# Patient Record
Sex: Female | Born: 1975 | Hispanic: No | Marital: Married | Smoking: Never smoker
Health system: Southern US, Community
[De-identification: ages and names within clinical notes are randomized; demographics above are authoritative.]

## PROBLEM LIST (undated history)

## (undated) DIAGNOSIS — R87619 Unspecified abnormal cytological findings in specimens from cervix uteri: Secondary | ICD-10-CM

## (undated) DIAGNOSIS — E079 Disorder of thyroid, unspecified: Secondary | ICD-10-CM

## (undated) DIAGNOSIS — D649 Anemia, unspecified: Secondary | ICD-10-CM

## (undated) DIAGNOSIS — B977 Papillomavirus as the cause of diseases classified elsewhere: Secondary | ICD-10-CM

## (undated) DIAGNOSIS — B009 Herpesviral infection, unspecified: Secondary | ICD-10-CM

## (undated) DIAGNOSIS — E039 Hypothyroidism, unspecified: Secondary | ICD-10-CM

## (undated) DIAGNOSIS — E041 Nontoxic single thyroid nodule: Secondary | ICD-10-CM

## (undated) DIAGNOSIS — Z1159 Encounter for screening for other viral diseases: Secondary | ICD-10-CM

## (undated) HISTORY — DX: Nontoxic single thyroid nodule: E04.1

## (undated) HISTORY — PX: COLPOSCOPY: SHX161

## (undated) HISTORY — PX: OTHER SURGICAL HISTORY: SHX169

## (undated) HISTORY — DX: Anemia, unspecified: D64.9

## (undated) HISTORY — DX: Hypothyroidism, unspecified: E03.9

## (undated) HISTORY — DX: Herpesviral infection, unspecified: B00.9

## (undated) HISTORY — DX: Encounter for screening for other viral diseases: Z11.59

## (undated) HISTORY — DX: Papillomavirus as the cause of diseases classified elsewhere: B97.7

## (undated) HISTORY — DX: Unspecified abnormal cytological findings in specimens from cervix uteri: R87.619

---
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
## 1999-01-24 ENCOUNTER — Emergency Department (HOSPITAL_COMMUNITY): Admission: EM | Admit: 1999-01-24 | Discharge: 1999-01-24 | Payer: Self-pay | Admitting: *Deleted

## 1999-01-25 ENCOUNTER — Encounter: Payer: Self-pay | Admitting: *Deleted

## 1999-11-27 ENCOUNTER — Ambulatory Visit (HOSPITAL_COMMUNITY): Admission: RE | Admit: 1999-11-27 | Discharge: 1999-11-27 | Payer: Self-pay | Admitting: Obstetrics and Gynecology

## 1999-11-27 ENCOUNTER — Encounter: Payer: Self-pay | Admitting: Obstetrics and Gynecology

## 2000-03-26 ENCOUNTER — Inpatient Hospital Stay (HOSPITAL_COMMUNITY): Admission: AD | Admit: 2000-03-26 | Discharge: 2000-03-26 | Payer: Self-pay | Admitting: Obstetrics and Gynecology

## 2000-04-02 ENCOUNTER — Inpatient Hospital Stay (HOSPITAL_COMMUNITY): Admission: AD | Admit: 2000-04-02 | Discharge: 2000-04-04 | Payer: Self-pay | Admitting: Obstetrics & Gynecology

## 2001-01-24 ENCOUNTER — Emergency Department (HOSPITAL_COMMUNITY): Admission: EM | Admit: 2001-01-24 | Discharge: 2001-01-24 | Payer: Self-pay | Admitting: Emergency Medicine

## 2001-02-02 ENCOUNTER — Encounter: Payer: Self-pay | Admitting: Family Medicine

## 2001-02-02 ENCOUNTER — Ambulatory Visit (HOSPITAL_COMMUNITY): Admission: RE | Admit: 2001-02-02 | Discharge: 2001-02-02 | Payer: Self-pay | Admitting: Family Medicine

## 2002-08-30 ENCOUNTER — Other Ambulatory Visit: Admission: RE | Admit: 2002-08-30 | Discharge: 2002-08-30 | Payer: Self-pay | Admitting: Obstetrics & Gynecology

## 2003-08-14 ENCOUNTER — Other Ambulatory Visit: Admission: RE | Admit: 2003-08-14 | Discharge: 2003-08-14 | Payer: Self-pay | Admitting: Obstetrics & Gynecology

## 2004-02-29 ENCOUNTER — Ambulatory Visit (HOSPITAL_COMMUNITY): Admission: RE | Admit: 2004-02-29 | Discharge: 2004-02-29 | Payer: Self-pay | Admitting: Obstetrics & Gynecology

## 2004-12-24 ENCOUNTER — Inpatient Hospital Stay (HOSPITAL_COMMUNITY): Admission: AD | Admit: 2004-12-24 | Discharge: 2004-12-26 | Payer: Self-pay | Admitting: Obstetrics and Gynecology

## 2008-01-17 ENCOUNTER — Ambulatory Visit: Payer: Self-pay | Admitting: Internal Medicine

## 2008-01-17 LAB — CONVERTED CEMR LAB
ALT: 11 units/L (ref 0–35)
AST: 13 units/L (ref 0–37)
Albumin: 4.1 g/dL (ref 3.5–5.2)
Alkaline Phosphatase: 55 units/L (ref 39–117)
BUN: 9 mg/dL (ref 6–23)
Basophils Relative: 1.2 % — ABNORMAL HIGH (ref 0.0–1.0)
CO2: 31 meq/L (ref 19–32)
Chloride: 105 meq/L (ref 96–112)
Cholesterol: 154 mg/dL (ref 0–200)
Creatinine, Ser: 0.7 mg/dL (ref 0.4–1.2)
Eosinophils Relative: 3.4 % (ref 0.0–5.0)
GFR calc non Af Amer: 103 mL/min
Hemoglobin, Urine: NEGATIVE
LDL Cholesterol: 98 mg/dL (ref 0–99)
Leukocytes, UA: NEGATIVE
Lymphocytes Relative: 52.8 % — ABNORMAL HIGH (ref 12.0–46.0)
Monocytes Relative: 6.2 % (ref 3.0–12.0)
Neutrophils Relative %: 36.4 % — ABNORMAL LOW (ref 43.0–77.0)
Nitrite: NEGATIVE
Platelets: 274 10*3/uL (ref 150–400)
Potassium: 3.9 meq/L (ref 3.5–5.1)
RBC: 4.03 M/uL (ref 3.87–5.11)
Specific Gravity, Urine: 1.01 (ref 1.000–1.03)
TSH: 1.43 microintl units/mL (ref 0.35–5.50)
Urine Glucose: NEGATIVE mg/dL
Urobilinogen, UA: 0.2 (ref 0.0–1.0)
WBC: 4.9 10*3/uL (ref 4.5–10.5)

## 2008-02-16 ENCOUNTER — Telehealth: Payer: Self-pay | Admitting: Internal Medicine

## 2008-08-21 ENCOUNTER — Ambulatory Visit: Payer: Self-pay | Admitting: Internal Medicine

## 2008-08-21 DIAGNOSIS — J069 Acute upper respiratory infection, unspecified: Secondary | ICD-10-CM | POA: Insufficient documentation

## 2010-07-22 ENCOUNTER — Emergency Department (HOSPITAL_BASED_OUTPATIENT_CLINIC_OR_DEPARTMENT_OTHER): Admission: EM | Admit: 2010-07-22 | Discharge: 2010-07-22 | Payer: Self-pay | Admitting: Emergency Medicine

## 2010-07-22 ENCOUNTER — Ambulatory Visit: Payer: Self-pay | Admitting: Diagnostic Radiology

## 2010-11-12 LAB — BASIC METABOLIC PANEL
BUN: 10 mg/dL (ref 6–23)
Chloride: 105 mEq/L (ref 96–112)
Creatinine, Ser: 0.6 mg/dL (ref 0.4–1.2)
GFR calc non Af Amer: >60 mL/min (ref >60)
Glucose, Bld: 102 mg/dL — ABNORMAL HIGH (ref 70–99)
Potassium: 4 mEq/L (ref 3.5–5.1)

## 2010-11-12 LAB — POCT CARDIAC MARKERS
CKMB, poc: <1 ng/mL — ABNORMAL LOW (ref 1.0–8.0)
CKMB, poc: <1 ng/mL — ABNORMAL LOW (ref 1.0–8.0)

## 2010-11-12 LAB — DIFFERENTIAL
Basophils Absolute: 0 10*3/uL (ref 0.0–0.1)
Basophils Relative: 0 % (ref 0–1)
Eosinophils Relative: 1 % (ref 0–5)
Monocytes Absolute: 0.6 10*3/uL (ref 0.1–1.0)

## 2010-11-12 LAB — PROTIME-INR
INR: 1.07 (ref 0.00–1.49)
Prothrombin Time: 14.1 seconds (ref 11.6–15.2)

## 2010-11-12 LAB — APTT: aPTT: 31 seconds (ref 24–37)

## 2010-11-12 LAB — CBC
HCT: 33.6 % — ABNORMAL LOW (ref 36.0–46.0)
MCHC: 35.2 g/dL (ref 30.0–36.0)
MCV: 90.4 fL (ref 78.0–100.0)
Platelets: 225 10*3/uL (ref 150–400)
RDW: 12.5 % (ref 11.5–15.5)
WBC: 8.6 10*3/uL (ref 4.0–10.5)

## 2011-01-17 NOTE — H&P (Signed)
[name], [name] Hurst               ACCOUNT NO.:  [id number]   MEDICAL RECORD NO.:  [id number]          PATIENT TYPE:  INP   LOCATION:  9145                          FACILITY:  [facility]   PHYSICIAN:  [name] Hurst, M.D.DATE OF BIRTH:  Feb 16, 1976   DATE OF ADMISSION:  12/24/2004  DATE OF DISCHARGE:                                HISTORY & PHYSICAL   CHIEF COMPLAINT:  Labor.   HISTORY OF PRESENT ILLNESS:  A 35 year old African-American female G4 P1 who  presents in active labor at 39 weeks. She has no known drug allergies.  Medications are prenatal vitamins. She has a history of three previous  pregnancies, two term deliveries, with one neonatal loss due to dehydration  in [place]. She has a history of also a 7-pound 6-ounce female and an 8-week  abortion. Family history is otherwise noncontributory for breast cancer,  heart disease, and hypertension.   PRENATAL LABORATORY DATA:  Reveals a blood type of O positive, Rh antibody  negative. Rubella immune. Hepatitis and HIV nonreactive. GBS is negative.   PHYSICAL EXAMINATION:  GENERAL:  Well-developed, well-nourished African-  American female, no acute distress.  HEENT:  Normal.  LUNGS:  Clear.  HEART:  Regular rhythm.  ABDOMEN:  Soft, gravid, nontender.  PELVIC:  Cervix 2 cm, 80%, vertex, -1.  EXTREMITIES:  Reveal no cords.  NEUROLOGIC:  Nonfocal.   IMPRESSION:  Term intrauterine pregnancy in active labor.   PLAN:  Proceed with attempts at vaginal delivery, epidural as needed,  Pitocin augmentation as needed.      RJT/MEDQ  D:  12/24/2004  T:  12/24/2004  Job:  [id number]

## 2011-01-17 NOTE — Op Note (Signed)
NAMEMONSERRATE, BLASCHKE               ACCOUNT NO.:  [id number]   MEDICAL RECORD NO.:  [id number]          PATIENT TYPE:  INP   LOCATION:  9145                          FACILITY:  [facility]   PHYSICIAN:  Lenoard Aden, M.D.DATE OF BIRTH:  06-Jan-1976   DATE OF PROCEDURE:  12/24/2004  DATE OF DISCHARGE:                                 OPERATIVE REPORT   OPERATIVE DELIVERY NOTE:   INDICATION FOR OPERATIVE DELIVERY:  Thick meconium with persistent variable  decelerations.   POSTOPERATIVE DIAGNOSIS:  Thick meconium with persistent variable  decelerations.   PROCEDURE:  Outlet vacuum-assisted delivery.   SURGEON:  Lenoard Aden, M.D.   ANESTHESIA:  Epidural.   ESTIMATED BLOOD LOSS:  500 mL.   COMPLICATIONS:  None.   DRAINS:  None.   Patient to recovery in good condition.   BRIEF OPERATIVE NOTE:  After being apprised of the risks and benefits of  vacuum assistance including the small incidence of cephalohematoma,  intracranial hemorrhage and scalp laceration, the Kiwi cup was shown to the  patient and the husband and explained in detail.  The bladder is  catheterized until empty.  The fetal vertex, LOA, less than 45 degrees, at  +3 to +4 station.  The Kiwi cup was placed for two pulls over intact  perineum.  A full-term living female, DeLee suctioning performed, Apgars 8 and  9.  Cord blood collected, cord pH collected.  Pediatricians in attendance.  Placenta is delivered spontaneously intact.  A three-vessel cord is noted.  Cervix is without lacerations, vagina without lacerations.  Estimated blood  loss 500 mL.  Mother and baby recovering in good condition.      RJT/MEDQ  D:  12/24/2004  T:  12/24/2004  Job:  [id number]

## 2011-01-17 NOTE — H&P (Signed)
[facility]  Patient:    [name] Hurst, [name] Hurst                        MRN: [id number] Adm. Date:  [id number] Attending:  Cleatrice Burke Dictator:   Wynelle Bourgeois, P.A.                         History and Physical  HISTORY OF PRESENT ILLNESS:   Ms. [name] Hurst is a 35 year old G3, para 1-0-1-0 at 40-6/7 weeks who presents for induction of labor for nonreactive NST and a positive CST.  She denies any leaking or bleeding.  Her pregnancy has been followed by the M.D. service and has been remarkable for:  #1 - Anemia, #2 - postdates, #3 - infant death at 5 months secondary to dehydration, #4 - history of heart surgery for septal defect, #5 - history of LGA infant, #6 - conceived on OCPs, #7 - questionable varicella exposure, and #8 - GBS is negative.  PRENATAL LABORATORY DATA:     Hemoglobin 11.4, hematocrit 34.7, platelets 287,000.  Blood type O-positive.  Antibody screen negative.  Sickle cell negative.  RPR nonreactive.  Rubella immune.  HBsAg negative.  HIV nonreactive.  Glucose challenge within normal limits.  Pap test:  ASCUS. Gonorrhea negative.  Chlamydia negative.  AFP free beta within normal limits.  OBSTETRICAL HISTORY:          Remarkable for a spontaneous vaginal delivery in April of 1998 of a viable female infant who weighed 5.5 kg at 40+ weeks gestation after 6 hours of labor induction.  This infant died at 34 months of age secondary to dehydration.  In 2000, she had an elective abortion at [redacted] weeks gestation.  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  History of alcohol use.  FAMILY HISTORY:               Family history is remarkable for a sister with hypertension, a sister with asthma, paternal aunt with breast cancer, paternal sister with domestic violence.  GENETIC HISTORY:              Genetic history is remarkable for  patient with congenital heart defect and patients cousin who had twins and a paternal grandmother who had twins.  SOCIAL HISTORY:               She is married to [name] Hurst, who is involved and supportive.  She is of the [place] faith.  She denies any alcohol or drug use currently.  PHYSICAL EXAMINATION  HEENT:                        Within normal limits.  NECK:                         Thyroid normal, not enlarged.  CHEST:                        Clear to auscultation bilaterally.  HEART:                        Regular.  ABDOMEN:  Gravid.  Vertex to [employer].  Her fetal monitor denotes stable fetal heart rate with questionable early decelerations with two uterine contractions, occasional accelerations and average variability with irregular uterine contractions every two to five minutes.  PELVIC:                       Cervical exam was 1 cm in the office.  EXTREMITIES:                  Within normal limits.  Negative Homans.  ASSESSMENT:                   1. Intrauterine pregnancy at 40-6/7 weeks.                               2. Nonreactive nonstress test with a positive                                  contraction stress test.                               3. Elective induction of labor.  PLAN:                         1. Admit to birthing suites per                                  Dr. Brita Romp. [place].                               2. Routine M.D. orders.                               3. Pitocin per protocol.                               4. Further orders per Dr. Kathryne Sharper. DD:  04/02/00 TD:  04/02/00 Job: [id number]

## 2011-07-28 ENCOUNTER — Encounter: Payer: Self-pay | Admitting: Internal Medicine

## 2011-08-12 ENCOUNTER — Ambulatory Visit: Payer: Self-pay | Admitting: Gastroenterology

## 2011-08-22 ENCOUNTER — Telehealth: Payer: Self-pay | Admitting: Gastroenterology

## 2011-08-22 NOTE — Telephone Encounter (Signed)
Message copied by Arna Snipe on Fri Aug 22, 2011  8:42 AM ------      Message from: Harlow Mares D      Created: Tue Aug 12, 2011 12:50 PM       Please bill for no show today

## 2011-09-05 ENCOUNTER — Ambulatory Visit: Payer: Self-pay | Admitting: Internal Medicine

## 2012-11-18 ENCOUNTER — Telehealth: Payer: Self-pay | Admitting: Gynecology

## 2012-11-22 NOTE — Telephone Encounter (Signed)
PT RETURNING JASMINE'S CALL

## 2012-11-23 NOTE — Telephone Encounter (Signed)
11/23/2012 pt. Notified of Neg pap results/cm

## 2014-04-19 ENCOUNTER — Encounter: Payer: Self-pay | Admitting: Certified Nurse Midwife

## 2014-04-20 ENCOUNTER — Ambulatory Visit (INDEPENDENT_AMBULATORY_CARE_PROVIDER_SITE_OTHER): Payer: 59 | Admitting: Certified Nurse Midwife

## 2014-04-20 ENCOUNTER — Encounter: Payer: Self-pay | Admitting: Certified Nurse Midwife

## 2014-04-20 VITALS — BP 110/62 | HR 68 | Resp 16 | Ht 63.75 in | Wt 167.0 lb

## 2014-04-20 DIAGNOSIS — Z0000 Encounter for general adult medical examination without abnormal findings: Secondary | ICD-10-CM

## 2014-04-20 DIAGNOSIS — Z124 Encounter for screening for malignant neoplasm of cervix: Secondary | ICD-10-CM

## 2014-04-20 DIAGNOSIS — Z01419 Encounter for gynecological examination (general) (routine) without abnormal findings: Secondary | ICD-10-CM

## 2014-04-20 LAB — POCT URINALYSIS DIPSTICK
BILIRUBIN UA: NEGATIVE
Glucose, UA: NEGATIVE
KETONES UA: NEGATIVE
Leukocytes, UA: NEGATIVE
Nitrite, UA: NEGATIVE
PH UA: 5
PROTEIN UA: NEGATIVE
RBC UA: NEGATIVE
Urobilinogen, UA: NEGATIVE

## 2014-04-20 NOTE — Patient Instructions (Signed)

## 2014-04-20 NOTE — Progress Notes (Signed)
38 y.o. [id number] Married African American Fe here for annual exam. Contraception Mirena IUD working well. 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 No health issues today. Sees PCP for aex and labs, all normal per patient.  No LMP recorded. Patient is not currently having periods (Reason: IUD).          Sexually active: Yes.    The current method of family planning is IUD.    Exercising: Yes.    walking Smoker:  no  Health Maintenance: Pap:  10-22-12 neg HPV HR neg MMG:  none Colonoscopy:  none BMD:   none TDaP:  2009 Labs: Poct urine-neg Self breast exam: done occ   reports that she has never smoked. She does not have any smokeless tobacco history on file. She reports that she drinks alcohol. She reports that she does not use illicit drugs.  History reviewed. No pertinent past medical history.  Past Surgical History  Procedure Laterality Date  . Valvular heart repair      age [age]  . Intrauterine device (iud) insertion  5/11    mirena     Current Outpatient Prescriptions  Medication Sig Dispense Refill  . levonorgestrel (MIRENA) 20 MCG/24HR IUD 1 each by Intrauterine route once.      . Levothyroxine Sodium (LEVOTHROID PO) Take by mouth daily.       No current facility-administered medications for this visit.    Family History  Problem Relation Age of Onset  . Cancer Mother     uterine  . Hypertension Mother   . Diabetes Mother   . Hypertension Father   . Diabetes Sister   . Breast cancer Paternal Aunt   . Diabetes Maternal Grandmother   . Cancer Sister     ROS:  Pertinent items are noted in HPI.  Otherwise, a comprehensive ROS was negative.  Exam:   BP 110/62  Pulse 68  Resp 16  Ht 5' 3.75" (1.619 m)  Wt 167 lb (75.751 kg)  BMI 28.90 kg/m2 Height: 5' 3.75" (161.9 cm)  Ht Readings from Last 3 Encounters:  04/20/14 5' 3.75" (1.619 m)    General appearance: alert, cooperative and appears stated age Head: Normocephalic, without obvious abnormality,  atraumatic Neck: no adenopathy, supple, symmetrical, trachea midline and thyroid normal to inspection and palpation Lungs: clear to auscultation bilaterally Breasts: normal appearance, no masses or tenderness, No nipple retraction or dimpling, No nipple discharge or bleeding, No axillary or supraclavicular adenopathy Heart: regular rate and rhythm Abdomen: soft, non-tender; no masses,  no organomegaly Extremities: extremities normal, atraumatic, no cyanosis or edema Skin: Skin color, texture, turgor normal. No rashes or lesions Lymph nodes: Cervical, supraclavicular, and axillary nodes normal. No abnormal inguinal nodes palpated Neurologic: Grossly normal   Pelvic: External genitalia:  no lesions              Urethra:  normal appearing urethra with no masses, tenderness or lesions              Bartholin's and Skene's: normal                 Vagina: normal appearing vagina with normal color and discharge, no lesions              Cervix: normal, non tender, no lesions              Pap taken: Yes.   Bimanual Exam:  Uterus:  normal size, contour, position, consistency, mobility, non-tender and anteverted  Adnexa: normal adnexa and no mass, fullness, tenderness               Rectovaginal: Confirms               Anus:  normal sphincter tone, no lesions  A:  Well Woman with normal exam  Contraception Mirena IUD  P:   Reviewed health and wellness pertinent to exam  Removal due 01/10/15  Pap smear taken today with HPV reflex   counseled on breast self exam,  HIV risk factors and prevention, adequate intake of calcium and vitamin D, diet and exercise  return annually or prn  An After Visit Summary was printed and given to the patient.

## 2014-04-20 NOTE — Progress Notes (Signed)
Reviewed personally.  M. Suzanne Onelia Cadmus, MD.  

## 2014-04-24 LAB — IPS PAP TEST WITH REFLEX TO HPV

## 2014-04-25 ENCOUNTER — Emergency Department (HOSPITAL_BASED_OUTPATIENT_CLINIC_OR_DEPARTMENT_OTHER)
Admission: EM | Admit: 2014-04-25 | Discharge: 2014-04-25 | Discharge status: Against Medical Advice | Payer: 59 | Attending: Emergency Medicine | Admitting: Emergency Medicine

## 2014-04-25 ENCOUNTER — Encounter (HOSPITAL_BASED_OUTPATIENT_CLINIC_OR_DEPARTMENT_OTHER): Payer: Self-pay | Admitting: Emergency Medicine

## 2014-04-25 DIAGNOSIS — R509 Fever, unspecified: Secondary | ICD-10-CM | POA: Diagnosis not present

## 2014-04-25 DIAGNOSIS — R51 Headache: Secondary | ICD-10-CM | POA: Diagnosis not present

## 2014-04-25 DIAGNOSIS — R5381 Other malaise: Secondary | ICD-10-CM | POA: Diagnosis not present

## 2014-04-25 DIAGNOSIS — R42 Dizziness and giddiness: Secondary | ICD-10-CM | POA: Diagnosis not present

## 2014-04-25 DIAGNOSIS — R5383 Other fatigue: Secondary | ICD-10-CM

## 2014-04-25 HISTORY — DX: Disorder of thyroid, unspecified: E07.9

## 2014-04-25 NOTE — ED Notes (Addendum)
Headache x4 days.  Dizziness and fatigue also since yesterday. Slight fever yesterday.  Pt later stated that she saw her pmd yesterday for the HA and was given Vicodin and Prednisone Rx's.  What has changed today is that she is tired and weak and had one episode of breaking out in a sweat.

## 2014-07-03 ENCOUNTER — Encounter (HOSPITAL_BASED_OUTPATIENT_CLINIC_OR_DEPARTMENT_OTHER): Payer: Self-pay | Admitting: Emergency Medicine

## 2014-12-07 ENCOUNTER — Telehealth: Payer: Self-pay | Admitting: Certified Nurse Midwife

## 2014-12-07 DIAGNOSIS — Z30433 Encounter for removal and reinsertion of intrauterine contraceptive device: Secondary | ICD-10-CM

## 2014-12-07 NOTE — Telephone Encounter (Signed)
Spoke with patient. Advised Mirena is due for removal before 01/10/2015. Patient is agreeable and would like to go ahead and schedule IUD removal and reinsertion. Appointment scheduled for 4/12 at 2pm with Regina Eck CNM. Patient is agreeable to date and time. Advised to take 800mg  of ibuprofen/motrin one hour before appointment.  Routing to provider for final review. Patient agreeable to disposition. Will close encounter

## 2014-12-07 NOTE — Telephone Encounter (Signed)
Patient wants to talk with the nurse concerning her IUD. She believes it may be time to have it removed.

## 2014-12-12 ENCOUNTER — Ambulatory Visit: Payer: Self-pay | Admitting: Certified Nurse Midwife

## 2014-12-12 ENCOUNTER — Telehealth: Payer: Self-pay | Admitting: Certified Nurse Midwife

## 2014-12-12 NOTE — Telephone Encounter (Signed)
Called patient regarding missed procedure appointment today. She thought it was next Tuesday. She would like nurse to call and reschedule.

## 2014-12-12 NOTE — Telephone Encounter (Signed)
Spoke with patient. Appointment rescheduled for 4/19 at 2pm with Regina Eck CNM for IUD removal and reinsertion. Patient is agreeable to date and time.  Routing to provider for final review. Patient agreeable to disposition. Will close encounter

## 2014-12-19 ENCOUNTER — Encounter: Payer: Self-pay | Admitting: Certified Nurse Midwife

## 2014-12-19 ENCOUNTER — Ambulatory Visit (INDEPENDENT_AMBULATORY_CARE_PROVIDER_SITE_OTHER): Payer: 59 | Admitting: Certified Nurse Midwife

## 2014-12-19 VITALS — BP 102/60 | HR 64 | Resp 16 | Ht 63.75 in | Wt 186.0 lb

## 2014-12-19 DIAGNOSIS — Z30433 Encounter for removal and reinsertion of intrauterine contraceptive device: Secondary | ICD-10-CM | POA: Diagnosis not present

## 2014-12-19 DIAGNOSIS — Z30018 Encounter for initial prescription of other contraceptives: Secondary | ICD-10-CM

## 2014-12-19 MED ORDER — ETONOGESTREL-ETHINYL ESTRADIOL 0.12-0.015 MG/24HR VA RING: 1.0000 | VAGINAL_RING | VAGINAL | Status: DC

## 2014-12-19 NOTE — Patient Instructions (Signed)

## 2014-12-19 NOTE — Progress Notes (Signed)
39 yo African American Married [id number] LMP unknown. Has not menses with Mirena IUD.   Presents for Mirena IUD removal.  Denies any vaginal symptoms or STD concerns.  Plans for contraception are Nuvaring.  LMP none with Mirena IUD, not due for removal until 5/16, but desires removal today.         HPI neg.  Exam: Affect normal, orientation x 3 Abdomen: soft non-tender Groin:no inguinal nodes palpated    Pelvic exam:Pelvic exam: VULVA: normal appearing vulva with no masses, tenderness or lesions. Vagina: normal, no discharge Cervix:  Normal appearance with IUD string noted in cervix Uterus:normal, non tender Adnexa: Normal, non tender, no masses  Procedure: Speculum placed, cervix visualized.  IUD string visualized, grasp with ring forceps, with gentle traction IUD removed intact.  IUD shown to patient and discarded. Speculum removed.   Assessment:MirenaMirena removal Pt tolerated procedure well.  Plan: Begin contraceptive choice of Nuvaring Rx Nuvaring with instructions. Patient inserted and removed Nuvaring without difficulty with Provider checking. Questions addressed  Return Visit 3 months for BP check and surveillance of Nuvaring information.

## 2014-12-20 NOTE — Progress Notes (Signed)
Reviewed personally.  M. Suzanne Kindle Strohmeier, MD.  

## 2015-03-20 ENCOUNTER — Ambulatory Visit: Payer: 59 | Admitting: Certified Nurse Midwife

## 2015-03-27 ENCOUNTER — Telehealth: Payer: Self-pay | Admitting: Certified Nurse Midwife

## 2015-03-27 NOTE — Telephone Encounter (Signed)
Patient canceled her appointment for 3 month reck. She states she will call back to reschedule at a later time.

## 2015-03-28 NOTE — Telephone Encounter (Signed)
agree

## 2015-03-30 ENCOUNTER — Ambulatory Visit: Payer: 59 | Admitting: Certified Nurse Midwife

## 2015-04-25 ENCOUNTER — Ambulatory Visit: Payer: 59 | Admitting: Certified Nurse Midwife

## 2015-04-26 ENCOUNTER — Encounter: Payer: Self-pay | Admitting: Certified Nurse Midwife

## 2015-08-16 ENCOUNTER — Ambulatory Visit (INDEPENDENT_AMBULATORY_CARE_PROVIDER_SITE_OTHER): Payer: 59 | Admitting: Obstetrics and Gynecology

## 2015-08-16 ENCOUNTER — Encounter: Payer: Self-pay | Admitting: Obstetrics and Gynecology

## 2015-08-16 VITALS — BP 122/78 | HR 56 | Resp 14 | Ht 63.5 in | Wt 182.0 lb

## 2015-08-16 DIAGNOSIS — Z124 Encounter for screening for malignant neoplasm of cervix: Secondary | ICD-10-CM | POA: Diagnosis not present

## 2015-08-16 DIAGNOSIS — Z3049 Encounter for surveillance of other contraceptives: Secondary | ICD-10-CM | POA: Diagnosis not present

## 2015-08-16 DIAGNOSIS — E079 Disorder of thyroid, unspecified: Secondary | ICD-10-CM | POA: Insufficient documentation

## 2015-08-16 DIAGNOSIS — Z01419 Encounter for gynecological examination (general) (routine) without abnormal findings: Secondary | ICD-10-CM

## 2015-08-16 DIAGNOSIS — N912 Amenorrhea, unspecified: Secondary | ICD-10-CM

## 2015-08-16 LAB — POCT URINE PREGNANCY: PREG TEST UR: NEGATIVE

## 2015-08-16 MED ORDER — ETONOGESTREL-ETHINYL ESTRADIOL 0.12-0.015 MG/24HR VA RING: VAGINAL_RING | VAGINAL | Status: DC

## 2015-08-16 NOTE — Progress Notes (Signed)
Patient ID: [name] Hurst, female   DOB: 29-Oct-1975, 39 y.o.   MRN: [id number] 39 y.o. [id number] MarriedAfrican AmericanF here for annual exam.  The patient is using the nuvaring, she is using it cyclically. No bleeding. Sexually active no pain.     Patient's last menstrual period was 02/14/2015 (approximate).          Sexually active: Yes.    The current method of family planning is NuvaRing vaginal inserts.    Exercising: No.  The patient does not participate in regular exercise at present. Smoker:  no  Health Maintenance: Pap:  04-20-14 WNL History of abnormal Pap:  no MMG:  Never Colonoscopy:  Never BMD:   Never TDaP:  10-31-07 Gardasil: N/A   reports that she has never smoked. She has never used smokeless tobacco. She reports that she drinks alcohol. She reports that she does not use illicit drugs. Occasional ETOH. Works as a Environmental health practitioner. She has 2 kids, daughter is 30, son is 84. She lost a child at 6 months, 18 years ago. Mom died of uterine cancer 3 years ago. Her Dad is here, she moved here from [place].   Past Medical History  Diagnosis Date  . Thyroid disease     Past Surgical History  Procedure Laterality Date  . Valvular heart repair      age [age]  . Intrauterine device (iud) insertion  5/11    mirena     Current Outpatient Prescriptions  Medication Sig Dispense Refill  . etonogestrel-ethinyl estradiol (NUVARING) 0.12-0.015 MG/24HR vaginal ring Place 1 each vaginally every 28 (twenty-eight) days. Insert vaginally and leave in place for 3 consecutive weeks, then remove for 1 week. 1 each 8  . cholecalciferol (VITAMIN D) 1000 UNITS tablet Take 1,000 Units by mouth daily. Reported on 08/16/2015    . levothyroxine (SYNTHROID, LEVOTHROID) 25 MCG tablet Take 25 mcg by mouth daily before breakfast. Reported on 08/16/2015     No current facility-administered medications for this visit.    Family History  Problem Relation Age of Onset  . Cancer Mother      uterine  . Hypertension Mother   . Diabetes Mother   . Hypertension Father   . Diabetes Sister   . Breast cancer Paternal Aunt   . Diabetes Maternal Grandmother   . Cancer Sister     Review of Systems  Constitutional: Negative.   HENT: Negative.   Eyes: Negative.   Respiratory: Negative.   Cardiovascular: Negative.   Gastrointestinal: Negative.   Endocrine: Negative.   Genitourinary: Positive for menstrual problem.       Amenorrhea   Musculoskeletal: Negative.   Skin: Negative.   Allergic/Immunologic: Negative.   Neurological: Negative.   Psychiatric/Behavioral: Negative.     Exam:   BP 122/78 mmHg  Pulse 56  Resp 14  Ht 5' 3.5" (1.613 m)  Wt 182 lb (82.555 kg)  BMI 31.73 kg/m2  LMP 02/14/2015 (Approximate)  Weight change: @WEIGHTCHANGE @ Height:   Height: 5' 3.5" (161.3 cm)  Ht Readings from Last 3 Encounters:  08/16/15 5' 3.5" (1.613 m)  12/19/14 5' 3.75" (1.619 m)  04/25/14 5\' 4"  (1.626 m)    General appearance: alert, cooperative and appears stated age Head: Normocephalic, without obvious abnormality, atraumatic Neck: no adenopathy, supple, symmetrical, trachea midline and thyroid normal to inspection and palpation Lungs: clear to auscultation bilaterally Breasts: normal appearance, no masses or tenderness Heart: regular rate and rhythm Abdomen: soft, non-tender; bowel sounds normal; no masses,  no organomegaly  Extremities: extremities normal, atraumatic, no cyanosis or edema Skin: Skin color, texture, turgor normal. No rashes or lesions Lymph nodes: Cervical, supraclavicular, and axillary nodes normal. No abnormal inguinal nodes palpated Neurologic: Grossly normal   Pelvic: External genitalia:  no lesions              Urethra:  normal appearing urethra with no masses, tenderness or lesions              Bartholins and Skenes: normal                 Vagina: normal appearing vagina with normal color and discharge, no lesions              Cervix: no  lesions               Bimanual Exam:  Uterus:  normal size, contour, position, consistency, mobility, non-tender              Adnexa: no mass, fullness, tenderness               Rectovaginal: Confirms               Anus:  normal sphincter tone, no lesions  Chaperone was present for exam.  A:  Well Woman with normal exam  Contraception, continue the nuvaring, will start using continuously  P:   Pap with hpv (patient desires)  Continue the nuvaring  Mammogram after she turns 40  Breast self exam handout given  Discussed calcium intake

## 2015-08-20 LAB — IPS PAP TEST WITH HPV

## 2015-08-21 NOTE — Addendum Note (Signed)
Addended by: Dorothy Spark on: 08/21/2015 04:58 PM   Modules accepted: Orders

## 2015-08-24 LAB — IPS HPV GENOTYPING 16/18

## 2015-08-28 ENCOUNTER — Telehealth: Payer: Self-pay

## 2015-08-28 NOTE — Telephone Encounter (Signed)
Patient returned call

## 2015-08-28 NOTE — Telephone Encounter (Signed)
Attempted to reach patient at number provided [phone]. There was no answer and recording states that the voicemail box is full and not accepting new messages at this time. Will try again later.

## 2015-08-28 NOTE — Telephone Encounter (Signed)
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

## 2015-08-29 NOTE — Telephone Encounter (Signed)
Left message to call Vue Pavon at [phone]. 

## 2015-08-29 NOTE — Telephone Encounter (Signed)
Spoke with patient. Advised of results as seen below. Patient is agreeable and verbalizes understanding. 08 recall placed.  Routing to provider for final review. Patient agreeable to disposition. Will close encounter.

## 2016-02-26 ENCOUNTER — Other Ambulatory Visit: Payer: Self-pay | Admitting: Nurse Practitioner

## 2016-02-26 DIAGNOSIS — Z1231 Encounter for screening mammogram for malignant neoplasm of breast: Secondary | ICD-10-CM

## 2016-03-07 ENCOUNTER — Ambulatory Visit
Admission: RE | Admit: 2016-03-07 | Discharge: 2016-03-07 | Disposition: A | Discharge status: Home / Self Care | Payer: 59 | Source: Ambulatory Visit | Attending: Nurse Practitioner | Admitting: Nurse Practitioner

## 2016-03-07 DIAGNOSIS — Z1231 Encounter for screening mammogram for malignant neoplasm of breast: Secondary | ICD-10-CM

## 2016-06-25 LAB — OB RESULTS CONSOLE HIV ANTIBODY (ROUTINE TESTING): HIV: NONREACTIVE

## 2016-08-28 ENCOUNTER — Other Ambulatory Visit: Payer: Self-pay | Admitting: Obstetrics and Gynecology

## 2016-08-28 DIAGNOSIS — Z3049 Encounter for surveillance of other contraceptives: Secondary | ICD-10-CM

## 2016-08-28 NOTE — Telephone Encounter (Signed)
Medication refill request: Etonogestrel-Ethinyl Estradiol Last AEX:  08/16/15 JJ Next AEX: 14!8 DL Last MMG (if hormonal medication request): 03/07/16 [facility] Refill authorized: 08/16/15 #3 Each 3R. Please advise. Thank you.

## 2016-08-31 ENCOUNTER — Other Ambulatory Visit: Payer: Self-pay | Admitting: Obstetrics and Gynecology

## 2016-08-31 DIAGNOSIS — Z3049 Encounter for surveillance of other contraceptives: Secondary | ICD-10-CM

## 2016-09-02 NOTE — Telephone Encounter (Signed)
Medication refill request: Etonogestrel-Ethinyl Estradiol Last AEX:  08/16/15 JJ Next AEX: 09/04/16 DL Last MMG (if hormonal medication request): 03/07/16 [facility] Refill authorized: 08/16/15 #3 Each 3R. Please advise. Thank you.

## 2016-09-04 ENCOUNTER — Encounter: Payer: Self-pay | Admitting: Certified Nurse Midwife

## 2016-09-04 ENCOUNTER — Ambulatory Visit (INDEPENDENT_AMBULATORY_CARE_PROVIDER_SITE_OTHER): Payer: 59 | Admitting: Certified Nurse Midwife

## 2016-09-04 VITALS — BP 106/68 | HR 68 | Resp 16 | Ht 64.0 in | Wt 183.0 lb

## 2016-09-04 DIAGNOSIS — Z3049 Encounter for surveillance of other contraceptives: Secondary | ICD-10-CM | POA: Diagnosis not present

## 2016-09-04 DIAGNOSIS — Z01419 Encounter for gynecological examination (general) (routine) without abnormal findings: Secondary | ICD-10-CM

## 2016-09-04 DIAGNOSIS — Z0000 Encounter for general adult medical examination without abnormal findings: Secondary | ICD-10-CM | POA: Diagnosis not present

## 2016-09-04 DIAGNOSIS — B009 Herpesviral infection, unspecified: Secondary | ICD-10-CM | POA: Insufficient documentation

## 2016-09-04 DIAGNOSIS — Z124 Encounter for screening for malignant neoplasm of cervix: Secondary | ICD-10-CM | POA: Diagnosis not present

## 2016-09-04 LAB — POCT URINALYSIS DIPSTICK
Bilirubin, UA: NEGATIVE
Glucose, UA: NEGATIVE
KETONES UA: NEGATIVE
Leukocytes, UA: NEGATIVE
Nitrite, UA: NEGATIVE
PH UA: 5
PROTEIN UA: NEGATIVE
RBC UA: NEGATIVE
Urobilinogen, UA: NEGATIVE

## 2016-09-04 MED ORDER — ETONOGESTREL-ETHINYL ESTRADIOL 0.12-0.015 MG/24HR VA RING: VAGINAL_RING | VAGINAL | 4 refills | Status: DC

## 2016-09-04 NOTE — Progress Notes (Signed)
41 y.o. [id number] Married  African American Fe here for annual exam. Periods normal, no issues, regular with Nuvaring . Happy with choice.  Sees Dr. Lennie Hummer for PCP for aex and labs.  Does not take Synthroid anymore, not needed per PCP. 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 Has updated RX. No health issues today.  Patient's last menstrual period was 08/30/2016 (exact date).          Sexually active: Yes.    The current method of family planning is NuvaRing vaginal inserts.    Exercising: Yes.    walking Smoker:  no  Health Maintenance: Pap:  08-16-15 neg HPV HR + 16/18 neg MMG:  03-07-16 category c density birads 1:neg Colonoscopy: none BMD:   none TDaP:  2009 Shingles: no Pneumonia: no Hep C and HIV: not done Labs: poct urine-neg Self breast exam: done occ   reports that she has never smoked. She has never used smokeless tobacco. She reports that she drinks alcohol. She reports that she does not use drugs.  Past Medical History:  Diagnosis Date  . Thyroid disease     Past Surgical History:  Procedure Laterality Date  . INTRAUTERINE DEVICE (IUD) INSERTION  5/11   mirena   . valvular heart repair     age [age]    Current Outpatient Prescriptions  Medication Sig Dispense Refill  . NUVARING 0.12-0.015 MG/24HR vaginal ring INSERT 1 RING VAGINALLY, LEAVE IN PLACE FOR 4 WEEKS, THEN REMOVE AND INSERT 1 NEW RING 3 each 0   No current facility-administered medications for this visit.     Family History  Problem Relation Age of Onset  . Cancer Mother     uterine  . Hypertension Mother   . Diabetes Mother   . Hypertension Father   . Diabetes Sister   . Breast cancer Paternal Aunt   . Diabetes Maternal Grandmother   . Cancer Sister     ROS:  Pertinent items are noted in HPI.  Otherwise, a comprehensive ROS was negative.  Exam:   BP 106/68   Pulse 68   Resp 16   Ht 5\' 4"  (1.626 m)   Wt 183 lb (83 kg)   LMP 08/30/2016 (Exact Date)   BMI 31.41 kg/m   Height: 5\' 4"  (162.6 cm) Ht Readings from Last 3 Encounters:  09/04/16 5\' 4"  (1.626 m)  08/16/15 5' 3.5" (1.613 m)  12/19/14 5' 3.75" (1.619 m)    General appearance: alert, cooperative and appears stated age Head: Normocephalic, without obvious abnormality, atraumatic Neck: no adenopathy, supple, symmetrical, trachea midline and thyroid normal to inspection and palpation Lungs: clear to auscultation bilaterally Breasts: normal appearance, no masses or tenderness, No nipple retraction or dimpling, No nipple discharge or bleeding, No axillary or supraclavicular adenopathy Heart: regular rate and rhythm Abdomen: soft, non-tender; no masses,  no organomegaly Extremities: extremities normal, atraumatic, no cyanosis or edema Skin: Skin color, texture, turgor normal. No rashes or lesions Lymph nodes: Cervical, supraclavicular, and axillary nodes normal. No abnormal inguinal nodes palpated Neurologic: Grossly normal   Pelvic: External genitalia:  no lesions              Urethra:  normal appearing urethra with no masses, tenderness or lesions              Bartholin's and Skene's: normal                 Vagina: normal appearing vagina with normal color and discharge,  no lesions              Cervix: multiparous appearance, no bleeding following Pap, no cervical motion tenderness and no lesions              Pap taken: Yes.   Bimanual Exam:  Uterus:  normal size, contour, position, consistency, mobility, non-tender and anteverted              Adnexa: normal adnexa and no mass, fullness, tenderness               Rectovaginal: Confirms               Anus:  normal sphincter tone, no lesions  Chaperone present: yes  A:  Well Woman with normal exam  Contraception Nuvaring desired  Follow pap smear from +HPVHR with negative 16,18  History of HSV 1 with PCP management  P:   Reviewed health and wellness pertinent to exam  Risks and benefits and warning signs given .  Rx Nuvaring see order with  instructions  Pap smear as above with HPVHR   counseled on breast self exam, mammography screening, adequate intake of calcium and vitamin D, diet and exercise  return annually or prn  An After Visit Summary was printed and given to the patient.

## 2016-09-04 NOTE — Telephone Encounter (Signed)
Updated today at appt.

## 2016-09-04 NOTE — Patient Instructions (Addendum)

## 2016-09-05 NOTE — Progress Notes (Signed)
Encounter reviewed [name] Kamiya, MD   

## 2016-09-08 LAB — IPS PAP TEST WITH HPV

## 2016-09-09 ENCOUNTER — Other Ambulatory Visit: Payer: Self-pay | Admitting: Certified Nurse Midwife

## 2016-09-09 NOTE — Addendum Note (Signed)
Addended by: Regina Eck on: 09/09/2016 11:50 AM   Modules accepted: Orders

## 2016-09-12 LAB — IPS HPV GENOTYPING 16/18

## 2016-09-17 ENCOUNTER — Other Ambulatory Visit: Payer: Self-pay | Admitting: Certified Nurse Midwife

## 2016-09-17 DIAGNOSIS — R87810 Cervical high risk human papillomavirus (HPV) DNA test positive: Secondary | ICD-10-CM

## 2016-09-22 ENCOUNTER — Telehealth: Payer: Self-pay | Admitting: Certified Nurse Midwife

## 2016-09-22 NOTE — Telephone Encounter (Signed)
See result note dated 09/22/16 -will close encounter.

## 2016-09-22 NOTE — Telephone Encounter (Signed)
Called patient to review benefits for procedure. Left voicemail to call back and review. °

## 2016-09-22 NOTE — Telephone Encounter (Signed)
Patient states a nurse called her Friday to schedule a procedure.  Would like to speak with someone and also schedule procedure

## 2016-09-25 NOTE — Telephone Encounter (Signed)
Patient called back and wants to reschedule her procedure

## 2016-09-26 ENCOUNTER — Ambulatory Visit: Payer: Self-pay | Admitting: Certified Nurse Midwife

## 2016-09-26 ENCOUNTER — Encounter: Payer: Self-pay | Admitting: Obstetrics & Gynecology

## 2016-09-26 NOTE — Progress Notes (Deleted)
09-04-16 neg HPV HR + Pt took ibuprofen at home at

## 2016-09-30 ENCOUNTER — Encounter: Payer: Self-pay | Admitting: Certified Nurse Midwife

## 2016-09-30 ENCOUNTER — Ambulatory Visit (INDEPENDENT_AMBULATORY_CARE_PROVIDER_SITE_OTHER): Payer: 59 | Admitting: Certified Nurse Midwife

## 2016-09-30 VITALS — BP 108/70 | Ht 64.0 in | Wt 183.0 lb

## 2016-09-30 DIAGNOSIS — Z01812 Encounter for preprocedural laboratory examination: Secondary | ICD-10-CM | POA: Diagnosis not present

## 2016-09-30 DIAGNOSIS — R87810 Cervical high risk human papillomavirus (HPV) DNA test positive: Secondary | ICD-10-CM

## 2016-09-30 DIAGNOSIS — R8789 Other abnormal findings in specimens from female genital organs: Secondary | ICD-10-CM

## 2016-09-30 DIAGNOSIS — R87618 Other abnormal cytological findings on specimens from cervix uteri: Secondary | ICD-10-CM

## 2016-09-30 DIAGNOSIS — N870 Mild cervical dysplasia: Secondary | ICD-10-CM | POA: Diagnosis not present

## 2016-09-30 LAB — POCT URINE PREGNANCY: PREG TEST UR: NEGATIVE

## 2016-09-30 NOTE — Progress Notes (Addendum)
Patient ID: [name] Hurst, female   DOB: [birth year], 41 y.o.   MRN: [id number]  Chief Complaint  Patient presents with  . Colposcopy    //jj    HPI [name] Hurst is a 41 y.o. [id number] african [place] female.  Here for colposcopy exam. Denies vaginal bleeding or pelvic pain. Contraception Nuvaring. Negative UPT today.  HPI Pap smear on 09/04/16 positive HPVHR, negative pap, Pap smear in 08/16/15 positive HPVHR with negative pap smear.  . Previous colposcopy: none. Prior cervical treatment:none  Patient discussed with Dr. Sabra Heck and felt together colposcopy indicated..  Past Medical History:  Diagnosis Date  . Abnormal Pap smear of cervix    ASCUS HPV HR+, 16/18 neg  . Substance abuse    HSV, HPV  . Thyroid disease     Past Surgical History:  Procedure Laterality Date  . INTRAUTERINE DEVICE (IUD) INSERTION  5/11   mirena   . valvular heart repair     age [age]    Family History  Problem Relation Age of Onset  . Cancer Mother     uterine  . Hypertension Mother   . Diabetes Mother   . Hypertension Father   . Diabetes Sister   . Breast cancer Paternal Aunt   . Diabetes Maternal Grandmother   . Cancer Sister     Social History Social History  Substance Use Topics  . Smoking status: Never Smoker  . Smokeless tobacco: Never Used  . Alcohol use 0.0 oz/week     Comment: occ    No Known Allergies  Current Outpatient Prescriptions  Medication Sig Dispense Refill  . etonogestrel-ethinyl estradiol (NUVARING) 0.12-0.015 MG/24HR vaginal ring INSERT 1 RING VAGINALLY, LEAVE IN PLACE FOR 4 WEEKS, THEN REMOVE AND INSERT 1 NEW RING 3 each 4   No current facility-administered medications for this visit.     Review of Systems Review of Systems  Constitutional: Negative.   Genitourinary: Negative for vaginal bleeding, vaginal discharge and vaginal pain.  Skin: Negative.     Blood pressure 108/70, height 5\' 4"  (1.626 m), weight 183 lb (83 kg), last menstrual  period 08/30/2016.  Physical Exam Physical Exam  Constitutional: She is oriented to person, place, and time. She appears well-developed and well-nourished.  Genitourinary: Vagina normal. There is no rash or tenderness on the right labia. There is no rash, tenderness or lesion on the left labia.    Neurological: She is alert and oriented to person, place, and time.  Skin: Skin is warm and dry.  Psychiatric: She has a normal mood and affect. Her behavior is normal. Judgment and thought content normal.    Data Reviewed Reviewed pap smear results and discussed HPVHR. Questions addressed.  Assessment    Procedure Details  The risks and benefits of the procedure and Written informed consent obtained.  Speculum placed in vagina and excellent visualization of cervix achieved, cervix swabbed x 3 with saline and  acetic acid solution. Slight acetowhite area  Noted at 4 o'clock. Lugol's applied and non staining noted in same area. Biopsy taken. ECC taken. Monsel's applied. No active bleeding noted on speculum removal. Instructions given. Patient tolerated procedure well.  Specimens: 2  Complications: none.     Plan    Specimens labelled and sent to Pathology.   Patient will be called with results when reviewed. Pathology reviewed and biopsy showed LSIL, Ecc  Showed definitive endocervical glands. Patient to be notified of results and need for repeat pap smear in one year. Pap recall 08  [name] Hurst 09/30/2016, 2:28 PM

## 2016-09-30 NOTE — Progress Notes (Signed)
1/18 neg HPV HR + 16/18 neg 12/16 pap ASCUS HPV HR + 16/18 neg UPT-neg

## 2016-09-30 NOTE — Patient Instructions (Signed)

## 2016-10-01 NOTE — Progress Notes (Signed)
Encounter reviewed Jill Jertson, MD   

## 2016-10-03 LAB — IPS OTHER TISSUE BIOPSY

## 2016-10-08 ENCOUNTER — Encounter: Payer: Self-pay | Admitting: Certified Nurse Midwife

## 2016-10-09 ENCOUNTER — Telehealth: Payer: Self-pay

## 2016-10-09 NOTE — Telephone Encounter (Signed)
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

## 2016-10-09 NOTE — Telephone Encounter (Signed)
Spoke with patient. Advised of results as seen below from [place]. Patient is agreeable and verbalizes understanding. 08 recall placed.  Routing to provider for final review. Patient agreeable to disposition. Will close encounter.

## 2017-03-31 DIAGNOSIS — M542 Cervicalgia: Secondary | ICD-10-CM | POA: Diagnosis not present

## 2017-03-31 DIAGNOSIS — R202 Paresthesia of skin: Secondary | ICD-10-CM | POA: Diagnosis not present

## 2017-03-31 DIAGNOSIS — M25511 Pain in right shoulder: Secondary | ICD-10-CM | POA: Diagnosis not present

## 2017-07-09 DIAGNOSIS — Z136 Encounter for screening for cardiovascular disorders: Secondary | ICD-10-CM | POA: Diagnosis not present

## 2017-07-09 DIAGNOSIS — Z01118 Encounter for examination of ears and hearing with other abnormal findings: Secondary | ICD-10-CM | POA: Diagnosis not present

## 2017-07-09 DIAGNOSIS — Z0000 Encounter for general adult medical examination without abnormal findings: Secondary | ICD-10-CM | POA: Diagnosis not present

## 2017-09-21 ENCOUNTER — Ambulatory Visit: Payer: 59 | Admitting: Family Medicine

## 2017-11-03 ENCOUNTER — Telehealth: Payer: Self-pay | Admitting: *Deleted

## 2017-11-03 NOTE — Telephone Encounter (Signed)
Left message for patient to call & schedule yearly aex.

## 2017-11-03 NOTE — Telephone Encounter (Signed)
Patient is in 08 recall for 10/2017. Please contact patient regarding scheduling AEX /PAP  Thanks

## 2017-11-05 NOTE — Telephone Encounter (Signed)
Left message to callback to schedule aex due to previous pap history

## 2017-11-06 NOTE — Telephone Encounter (Signed)
No callback from patient or appt scheduled. Routed to [place], [place]

## 2017-11-12 NOTE — Telephone Encounter (Signed)
Patient has not returned call regarding 08 recall. Please advise on recall status/letter Thanks

## 2017-11-13 NOTE — Telephone Encounter (Signed)
Pt with hx of CIN 1 and +HR HPV.  Ok to send pap letter and then remove from recall.  Thanks.

## 2017-11-16 ENCOUNTER — Encounter: Payer: Self-pay | Admitting: *Deleted

## 2017-11-16 NOTE — Telephone Encounter (Signed)
Letter sent - removed from recall -eh 

## 2018-06-24 ENCOUNTER — Ambulatory Visit: Payer: 59 | Admitting: Family Medicine

## 2018-06-30 ENCOUNTER — Ambulatory Visit (INDEPENDENT_AMBULATORY_CARE_PROVIDER_SITE_OTHER): Payer: 59 | Admitting: Family Medicine

## 2018-06-30 ENCOUNTER — Ambulatory Visit: Payer: 59 | Admitting: Family Medicine

## 2018-06-30 ENCOUNTER — Encounter

## 2018-06-30 ENCOUNTER — Encounter: Payer: Self-pay | Admitting: Family Medicine

## 2018-06-30 VITALS — BP 115/80 | HR 63 | Temp 98.2°F | Resp 20 | Ht 63.75 in | Wt 193.0 lb

## 2018-06-30 DIAGNOSIS — Z0000 Encounter for general adult medical examination without abnormal findings: Secondary | ICD-10-CM

## 2018-06-30 DIAGNOSIS — Z130 Encounter for screening for diseases of the blood and blood-forming organs and certain disorders involving the immune mechanism: Secondary | ICD-10-CM | POA: Diagnosis not present

## 2018-06-30 DIAGNOSIS — Z131 Encounter for screening for diabetes mellitus: Secondary | ICD-10-CM

## 2018-06-30 DIAGNOSIS — Z01118 Encounter for examination of ears and hearing with other abnormal findings: Secondary | ICD-10-CM

## 2018-06-30 DIAGNOSIS — Z0001 Encounter for general adult medical examination with abnormal findings: Secondary | ICD-10-CM | POA: Diagnosis not present

## 2018-06-30 DIAGNOSIS — E079 Disorder of thyroid, unspecified: Secondary | ICD-10-CM

## 2018-06-30 DIAGNOSIS — E049 Nontoxic goiter, unspecified: Secondary | ICD-10-CM

## 2018-06-30 DIAGNOSIS — Z1239 Encounter for other screening for malignant neoplasm of breast: Secondary | ICD-10-CM

## 2018-06-30 DIAGNOSIS — E669 Obesity, unspecified: Secondary | ICD-10-CM | POA: Diagnosis not present

## 2018-06-30 DIAGNOSIS — Z7689 Persons encountering health services in other specified circumstances: Secondary | ICD-10-CM

## 2018-06-30 LAB — LIPID PANEL
CHOLESTEROL: 147 mg/dL (ref 0–200)
HDL: 55.1 mg/dL (ref >39.00)
LDL Cholesterol: 79 mg/dL (ref 0–99)
NonHDL: 91.85
TRIGLYCERIDES: 66 mg/dL (ref 0.0–149.0)
Total CHOL/HDL Ratio: 3
VLDL: 13.2 mg/dL (ref 0.0–40.0)

## 2018-06-30 LAB — COMPREHENSIVE METABOLIC PANEL
ALBUMIN: 4.2 g/dL (ref 3.5–5.2)
ALK PHOS: 66 U/L (ref 39–117)
ALT: 12 U/L (ref 0–35)
AST: 12 U/L (ref 0–37)
BUN: 10 mg/dL (ref 6–23)
CALCIUM: 9.2 mg/dL (ref 8.4–10.5)
CO2: 26 mEq/L (ref 19–32)
CREATININE: 0.77 mg/dL (ref 0.40–1.20)
Chloride: 105 mEq/L (ref 96–112)
GFR: 87.06 mL/min (ref >60.00)
Glucose, Bld: 99 mg/dL (ref 70–99)
POTASSIUM: 4.1 meq/L (ref 3.5–5.1)
Sodium: 139 mEq/L (ref 135–145)
TOTAL PROTEIN: 6.9 g/dL (ref 6.0–8.3)
Total Bilirubin: 0.4 mg/dL (ref 0.2–1.2)

## 2018-06-30 LAB — CBC WITH DIFFERENTIAL/PLATELET
BASOS PCT: 1.7 % (ref 0.0–3.0)
Basophils Absolute: 0.1 10*3/uL (ref 0.0–0.1)
EOS PCT: 8.6 % — AB (ref 0.0–5.0)
Eosinophils Absolute: 0.5 10*3/uL (ref 0.0–0.7)
HEMATOCRIT: 37.8 % (ref 36.0–46.0)
HEMOGLOBIN: 12.4 g/dL (ref 12.0–15.0)
LYMPHS PCT: 43.1 % (ref 12.0–46.0)
Lymphs Abs: 2.5 10*3/uL (ref 0.7–4.0)
MCHC: 32.9 g/dL (ref 30.0–36.0)
MCV: 92.4 fl (ref 78.0–100.0)
Monocytes Absolute: 0.4 10*3/uL (ref 0.1–1.0)
Monocytes Relative: 7.4 % (ref 3.0–12.0)
Neutro Abs: 2.2 10*3/uL (ref 1.4–7.7)
Neutrophils Relative %: 39.2 % — ABNORMAL LOW (ref 43.0–77.0)
Platelets: 307 10*3/uL (ref 150.0–400.0)
RBC: 4.09 Mil/uL (ref 3.87–5.11)
RDW: 13.7 % (ref 11.5–15.5)
WBC: 5.7 10*3/uL (ref 4.0–10.5)

## 2018-06-30 LAB — TSH: TSH: 5.14 u[IU]/mL — ABNORMAL HIGH (ref 0.35–4.50)

## 2018-06-30 LAB — T4, FREE: FREE T4: 0.93 ng/dL (ref 0.60–1.60)

## 2018-06-30 LAB — T3, FREE: T3, Free: 3.4 pg/mL (ref 2.3–4.2)

## 2018-06-30 LAB — HEMOGLOBIN A1C: Hgb A1c MFr Bld: 5.7 % (ref 4.6–6.5)

## 2018-06-30 NOTE — Patient Instructions (Addendum)
It was a pleasure to meet you.  Start OTC vit d at 1000 u daily with food.   Please help [place] help you:  We are honored you have chosen [facility] for your [facility] home. Below you will find basic instructions that you may need to access in the future. Please help [place] help you by reading the instructions, which cover many of the frequent questions we experience.   Prescription refills and request:  -In order to allow more efficient response time, please call your pharmacy for all refills. They will forward the request electronically to [place]. This allows for the quickest possible response. Request left on a nurse line can take longer to refill, since these are checked as time allows between office patients and other phone calls.  - refill request can take up to 3-5 working days to complete.  - If request is sent electronically and request is appropiate, it is usually completed in 1-2 business days.  - all patients will need to be seen routinely for all chronic medical conditions requiring prescription medications (see follow-up below). If you are overdue for follow up on your condition, you will be asked to make an appointment and we will call in enough medication to cover you until your appointment (up to 30 days).  - all controlled substances will require a face to face visit to request/refill.  - if you desire your prescriptions to go through a new pharmacy, and have an active script at original pharmacy, you will need to call your pharmacy and have scripts transferred to new pharmacy. This is completed between the pharmacy locations and not by your provider.    Results: If any images or labs were ordered, it can take up to 1 week to get results depending on the test ordered and the lab/facility running and resulting the test. - Normal or stable results, which do not need further discussion, may be released to your mychart immediately with attached note to you. A call may not be generated for  normal results. Please make certain to sign up for mychart. If you have questions on how to activate your mychart you can call the front office.  - If your results need further discussion, our office will attempt to contact you via phone, and if unable to reach you after 2 attempts, we will release your abnormal result to your mychart with instructions.  - All results will be automatically released in mychart after 1 week.  - Your provider will provide you with explanation and instruction on all relevant material in your results. Please keep in mind, results and labs may appear confusing or abnormal to the untrained eye, but it does not mean they are actually abnormal for you personally. If you have any questions about your results that are not covered, or you desire more detailed explanation than what was provided, you should make an appointment with your provider to do so.   Our office handles many outgoing and incoming calls daily. If we have not contacted you within 1 week about your results, please check your mychart to see if there is a message first and if not, then contact our office.  In helping with this matter, you help decrease call volume, and therefore allow [place] to be able to respond to patients needs more efficiently.   Acute office visits (sick visit):  An acute visit is intended for a new problem and are scheduled in shorter time slots to allow schedule openings for  patients with new problems. This is the appropriate visit to discuss a new problem. Problems will not be addressed by phone call or Echart message. Appointment is needed if requesting treatment. In order to provide you with excellent quality medical care with proper time for you to explain your problem, have an exam and receive treatment with instructions, these appointments should be limited to one new problem per visit. If you experience a new problem, in which you desire to be addressed, please make an acute office visit, we  save openings on the schedule to accommodate you. Please do not save your new problem for any other type of visit, let us take care of it properly and quickly for you.   Follow up visits:  Depending on your condition(s) your provider will need to see you routinely in order to provide you with quality care and prescribe medication(s). Most chronic conditions (Example: hypertension, Diabetes, depression/anxiety... etc), require visits a couple times a year. Your provider will instruct you on proper follow up for your personal medical conditions and history. Please make certain to make follow up appointments for your condition as instructed. Failing to do so could result in lapse in your medication treatment/refills. If you request a refill, and are overdue to be seen on a condition, we will always provide you with a 30 day script (once) to allow you time to schedule.    Medicare wellness (well visit): - we have a wonderful Nurse Maudie Mercury), that will meet with you and provide you will yearly medicare wellness visits. These visits should occur yearly (can not be scheduled less than 1 calendar year apart) and cover preventive health, immunizations, advance directives and screenings you are entitled to yearly through your medicare benefits. Do not miss out on your entitled benefits, this is when medicare will pay for these benefits to be ordered for you.  These are strongly encouraged by your provider and is the appropriate type of visit to make certain you are up to date with all preventive health benefits. If you have not had your medicare wellness exam in the last 12 months, please make certain to schedule one by calling the office and schedule your medicare wellness with Maudie Mercury as soon as possible.   Yearly physical (well visit):  - Adults are recommended to be seen yearly for physicals. Check with your insurance and date of your last physical, most insurances require one calendar year between physicals. Physicals  include all preventive health topics, screenings, medical exam and labs that are appropriate for gender/age and history. You may have fasting labs needed at this visit. This is a well visit (not a sick visit), new problems should not be covered during this visit (see acute visit).  - Pediatric patients are seen more frequently when they are younger. Your provider will advise you on well child visit timing that is appropriate for your their age. - This is not a medicare wellness visit. Medicare wellness exams do not have an exam portion to the visit. Some medicare companies allow for a physical, some do not allow a yearly physical. If your medicare allows a yearly physical you can schedule the medicare wellness with our nurse Maudie Mercury and have your physical with your provider after, on the same day. Please check with insurance for your full benefits.   Late Policy/No Shows:  - all new patients should arrive 15-30 minutes earlier than appointment to allow [place] time  to  obtain all personal demographics,  insurance information and for  you to complete office paperwork. - All established patients should arrive 10-15 minutes earlier than appointment time to update all information and be checked in .  - In our best efforts to run on time, if you are late for your appointment you will be asked to either reschedule or if able, we will work you back into the schedule. There will be a wait time to work you back in the schedule,  depending on availability.  - If you are unable to make it to your appointment as scheduled, please call 24 hours ahead of time to allow [place] to fill the time slot with someone else who needs to be seen. If you do not cancel your appointment ahead of time, you may be charged a no show fee.   Health Maintenance, Female Adopting a healthy lifestyle and getting preventive care can go a long way to promote health and wellness. Talk with your health care provider about what schedule of regular  examinations is right for you. This is a good chance for you to check in with your provider about disease prevention and staying healthy. In between checkups, there are plenty of things you can do on your own. Experts have done a lot of research about which lifestyle changes and preventive measures are most likely to keep you healthy. Ask your health care provider for more information. Weight and diet Eat a healthy diet  Be sure to include plenty of vegetables, fruits, low-fat dairy products, and lean protein.  Do not eat a lot of foods high in solid fats, added sugars, or salt.  Get regular exercise. This is one of the most important things you can do for your health. ? Most adults should exercise for at least 150 minutes each week. The exercise should increase your heart rate and make you sweat (moderate-intensity exercise). ? Most adults should also do strengthening exercises at least twice a week. This is in addition to the moderate-intensity exercise.  Maintain a healthy weight  Body mass index (BMI) is a measurement that can be used to identify possible weight problems. It estimates body fat based on height and weight. Your health care provider can help determine your BMI and help you achieve or maintain a healthy weight.  For females 31 years of age and older: ? A BMI below 18.5 is considered underweight. ? A BMI of 18.5 to 24.9 is normal. ? A BMI of 25 to 29.9 is considered overweight. ? A BMI of 30 and above is considered obese.  Watch levels of cholesterol and blood lipids  You should start having your blood tested for lipids and cholesterol at 42 years of age, then have this test every 5 years.  You may need to have your cholesterol levels checked more often if: ? Your lipid or cholesterol levels are high. ? You are older than 42 years of age. ? You are at high risk for heart disease.  Cancer screening Lung Cancer  Lung cancer screening is recommended for adults 52-39  years old who are at high risk for lung cancer because of a history of smoking.  A yearly low-dose CT scan of the lungs is recommended for people who: ? Currently smoke. ? Have quit within the past 15 years. ? Have at least a 30-pack-year history of smoking. A pack year is smoking an average of one pack of cigarettes a day for 1 year.  Yearly screening should continue until it has been 15 years since you quit.  Yearly screening should stop if you develop a health problem that would prevent you from having lung cancer treatment.  Breast Cancer  Practice breast self-awareness. This means understanding how your breasts normally appear and feel.  It also means doing regular breast self-exams. Let your health care provider know about any changes, no matter how small.  If you are in your 20s or 30s, you should have a clinical breast exam (CBE) by a health care provider every 1-3 years as part of a regular health exam.  If you are 48 or older, have a CBE every year. Also consider having a breast X-ray (mammogram) every year.  If you have a family history of breast cancer, talk to your health care provider about genetic screening.  If you are at high risk for breast cancer, talk to your health care provider about having an MRI and a mammogram every year.  Breast cancer gene (BRCA) assessment is recommended for women who have family members with BRCA-related cancers. BRCA-related cancers include: ? Breast. ? Ovarian. ? Tubal. ? Peritoneal cancers.  Results of the assessment will determine the need for genetic counseling and BRCA1 and BRCA2 testing.  Cervical Cancer Your health care provider may recommend that you be screened regularly for cancer of the pelvic organs (ovaries, uterus, and vagina). This screening involves a pelvic examination, including checking for microscopic changes to the surface of your cervix (Pap test). You may be encouraged to have this screening done every 3 years,  beginning at age 79.  For women ages 19-65, health care providers may recommend pelvic exams and Pap testing every 3 years, or they may recommend the Pap and pelvic exam, combined with testing for human papilloma virus (HPV), every 5 years. Some types of HPV increase your risk of cervical cancer. Testing for HPV may also be done on women of any age with unclear Pap test results.  Other health care providers may not recommend any screening for nonpregnant women who are considered low risk for pelvic cancer and who do not have symptoms. Ask your health care provider if a screening pelvic exam is right for you.  If you have had past treatment for cervical cancer or a condition that could lead to cancer, you need Pap tests and screening for cancer for at least 20 years after your treatment. If Pap tests have been discontinued, your risk factors (such as having a new sexual partner) need to be reassessed to determine if screening should resume. Some women have medical problems that increase the chance of getting cervical cancer. In these cases, your health care provider may recommend more frequent screening and Pap tests.  Colorectal Cancer  This type of cancer can be detected and often prevented.  Routine colorectal cancer screening usually begins at 42 years of age and continues through 42 years of age.  Your health care provider may recommend screening at an earlier age if you have risk factors for colon cancer.  Your health care provider may also recommend using home test kits to check for hidden blood in the stool.  A small camera at the end of a tube can be used to examine your colon directly (sigmoidoscopy or colonoscopy). This is done to check for the earliest forms of colorectal cancer.  Routine screening usually begins at age 60.  Direct examination of the colon should be repeated every 5-10 years through 42 years of age. However, you may need to be screened more often if early forms of  precancerous  polyps or small growths are found.  Skin Cancer  Check your skin from head to toe regularly.  Tell your health care provider about any new moles or changes in moles, especially if there is a change in a mole's shape or color.  Also tell your health care provider if you have a mole that is larger than the size of a pencil eraser.  Always use sunscreen. Apply sunscreen liberally and repeatedly throughout the day.  Protect yourself by wearing long sleeves, pants, a wide-brimmed hat, and sunglasses whenever you are outside.  Heart disease, diabetes, and high blood pressure  High blood pressure causes heart disease and increases the risk of stroke. High blood pressure is more likely to develop in: ? People who have blood pressure in the high end of the normal range (130-139/85-89 mm Hg). ? People who are overweight or obese. ? People who are African American.  If you are 63-59 years of age, have your blood pressure checked every 3-5 years. If you are 67 years of age or older, have your blood pressure checked every year. You should have your blood pressure measured twice-once when you are at a hospital or clinic, and once when you are not at a hospital or clinic. Record the average of the two measurements. To check your blood pressure when you are not at a hospital or clinic, you can use: ? An automated blood pressure machine at a pharmacy. ? A home blood pressure monitor.  If you are between 58 years and 33 years old, ask your health care provider if you should take aspirin to prevent strokes.  Have regular diabetes screenings. This involves taking a blood sample to check your fasting blood sugar level. ? If you are at a normal weight and have a low risk for diabetes, have this test once every three years after 42 years of age. ? If you are overweight and have a high risk for diabetes, consider being tested at a younger age or more often. Preventing infection Hepatitis B  If  you have a higher risk for hepatitis B, you should be screened for this virus. You are considered at high risk for hepatitis B if: ? You were born in a country where hepatitis B is common. Ask your health care provider which countries are considered high risk. ? Your parents were born in a high-risk country, and you have not been immunized against hepatitis B (hepatitis B vaccine). ? You have HIV or AIDS. ? You use needles to inject street drugs. ? You live with someone who has hepatitis B. ? You have had sex with someone who has hepatitis B. ? You get hemodialysis treatment. ? You take certain medicines for conditions, including cancer, organ transplantation, and autoimmune conditions.  Hepatitis C  Blood testing is recommended for: ? Everyone born from [birth year] through 1965. ? Anyone with known risk factors for hepatitis C.  Sexually transmitted infections (STIs)  You should be screened for sexually transmitted infections (STIs) including gonorrhea and chlamydia if: ? You are sexually active and are younger than 42 years of age. ? You are older than 42 years of age and your health care provider tells you that you are at risk for this type of infection. ? Your sexual activity has changed since you were last screened and you are at an increased risk for chlamydia or gonorrhea. Ask your health care provider if you are at risk.  If you do not have HIV, but are at risk,  it may be recommended that you take a prescription medicine daily to prevent HIV infection. This is called pre-exposure prophylaxis (PrEP). You are considered at risk if: ? You are sexually active and do not regularly use condoms or know the HIV status of your partner(s). ? You take drugs by injection. ? You are sexually active with a partner who has HIV.  Talk with your health care provider about whether you are at high risk of being infected with HIV. If you choose to begin PrEP, you should first be tested for HIV. You should  then be tested every 3 months for as long as you are taking PrEP. Pregnancy  If you are premenopausal and you may become pregnant, ask your health care provider about preconception counseling.  If you may become pregnant, take 400 to 800 micrograms (mcg) of folic acid every day.  If you want to prevent pregnancy, talk to your health care provider about birth control (contraception). Osteoporosis and menopause  Osteoporosis is a disease in which the bones lose minerals and strength with aging. This can result in serious bone fractures. Your risk for osteoporosis can be identified using a bone density scan.  If you are 64 years of age or older, or if you are at risk for osteoporosis and fractures, ask your health care provider if you should be screened.  Ask your health care provider whether you should take a calcium or vitamin D supplement to lower your risk for osteoporosis.  Menopause may have certain physical symptoms and risks.  Hormone replacement therapy may reduce some of these symptoms and risks. Talk to your health care provider about whether hormone replacement therapy is right for you. Follow these instructions at home:  Schedule regular health, dental, and eye exams.  Stay current with your immunizations.  Do not use any tobacco products including cigarettes, chewing tobacco, or electronic cigarettes.  If you are pregnant, do not drink alcohol.  If you are breastfeeding, limit how much and how often you drink alcohol.  Limit alcohol intake to no more than 1 drink per day for nonpregnant women. One drink equals 12 ounces of beer, 5 ounces of wine, or 1 ounces of hard liquor.  Do not use street drugs.  Do not share needles.  Ask your health care provider for help if you need support or information about quitting drugs.  Tell your health care provider if you often feel depressed.  Tell your health care provider if you have ever been abused or do not feel safe at  home. This information is not intended to replace advice given to you by your health care provider. Make sure you discuss any questions you have with your health care provider. Document Released: 03/03/2011 Document Revised: 01/24/2016 Document Reviewed: 05/22/2015 [employer]vier Interactive Patient Education  [employer].

## 2018-06-30 NOTE — Progress Notes (Signed)
Patient ID: [name] Hurst, female  DOB: 03-Jul-1976, 42 y.o.   MRN: [id number] Patient Care Team    Relationship Specialty Notifications Start End  Ma Hillock, DO PCP - General Family Medicine  06/30/18   Kem Boroughs, [place]  Gynecology  06/30/18     Chief Complaint  Patient presents with  . Establish Care  . Annual Exam    Subjective:  [name] Hurst is a 42 y.o.  female present for new patient establishment. All past medical history, surgical history, allergies, family history, immunizations, medications and social history were entered in the electronic medical record today. All recent labs, ED visits and hospitalizations within the last year were reviewed.  Health maintenance:  Colonoscopy: no fhx, screen at 50 Mammogram: completed:09/04/2016- [facility] at [facility] Cervical cancer screening: last pap: 09/04/2016, results: HRHPV, completed by: Raquel Sarna, NP Immunizations: tdap 2009 (she is checking with corporate health to see if she has had one since that time), Influenza UTD 2019 (encouraged yearly Infectious disease screening: HIV 2017 DEXA: N/A Assistive device: reviewed Oxygen use: reviewed Patient has a Dental home. Hospitalizations/ED visits:  Depression screen [facility] 2/9 06/30/2018  Decreased Interest 0  Down, Depressed, Hopeless 0  PHQ - 2 Score 0   No flowsheet data found.    Current Exercise Habits: The patient does not participate in regular exercise at present Exercise limited by: None identified No flowsheet data found.   Immunization History  Administered Date(s) Administered  . Hepatitis A 06/27/2009  . Td 10/31/2007  . Typhoid Live 06/27/2009  . Varicella 10/31/2007  . Yellow Fever 06/27/2009    No exam data present  Past Medical History:  Diagnosis Date  . Abnormal Pap smear of cervix    12/16 neg pap HPV HR+ 16/18 neg, 1/18 neg HPV HR + genotypes 16/18/45 neg  . Substance abuse (HCC)    HSV, HPV  . Thyroid  disease    No Known Allergies Past Surgical History:  Procedure Laterality Date  . COLPOSCOPY     1/18 LGSIL  . valvular heart repair     age [age]   Family History  Problem Relation Age of Onset  . Cancer Mother        uterine  . Hypertension Mother   . Diabetes Mother   . Hypertension Father   . Diabetes Sister   . Breast cancer Paternal Aunt   . Diabetes Maternal Grandmother   . Cancer Sister    Social History   Socioeconomic History  . Marital status: Married    Spouse name: Not on file  . Number of children: Not on file  . Years of education: Not on file  . Highest education level: Not on file  Occupational History  . Not on file  Social Needs  . Financial resource strain: Not on file  . Food insecurity:    Worry: Not on file    Inability: Not on file  . Transportation needs:    Medical: Not on file    Non-medical: Not on file  Tobacco Use  . Smoking status: Never Smoker  . Smokeless tobacco: Never Used  Substance and Sexual Activity  . Alcohol use: Yes    Alcohol/week: 0.0 standard drinks    Comment: occ  . Drug use: No  . Sexual activity: Yes    Partners: Male    Birth control/protection: Inserts    Comment: nuvaring  Lifestyle  . Physical activity:    Days per week: Not on  file    Minutes per session: Not on file  . Stress: Not on file  Relationships  . Social connections:    Talks on phone: Not on file    Gets together: Not on file    Attends religious service: Not on file    Active member of club or organization: Not on file    Attends meetings of clubs or organizations: Not on file    Relationship status: Not on file  . Intimate partner violence:    Fear of current or ex partner: Not on file    Emotionally abused: Not on file    Physically abused: Not on file    Forced sexual activity: Not on file  Other Topics Concern  . Not on file  Social History Narrative  . Not on file   Allergies as of 06/30/2018   No Known Allergies       Medication List    as of 06/30/2018 12:06 PM   You have not been prescribed any medications.     All past medical history, surgical history, allergies, family history, immunizations andmedications were updated in the EMR today and reviewed under the history and medication portions of their EMR.    No results found for this or any previous visit (from the past 2160 hour(s)).  Mm Screening Breast Tomo Bilateral  Result Date: 03/07/2016 CLINICAL DATA:  Screening. EXAM: 2D DIGITAL SCREENING BILATERAL MAMMOGRAM WITH CAD AND ADJUNCT TOMO COMPARISON:  None. ACR Breast Density Category c: The breast tissue is heterogeneously dense, which may obscure small masses FINDINGS: There are no findings suspicious for malignancy. Images were processed with CAD. IMPRESSION: No mammographic evidence of malignancy. A result letter of this screening mammogram will be mailed directly to the patient. RECOMMENDATION: Screening mammogram in one year. (Code:SM-B-01Y) BI-RADS CATEGORY  1: Negative. Electronically Signed   By: Lajean Manes M.D.   On: 03/07/2016 13:02     ROS: 14 pt review of systems performed and negative (unless mentioned in an HPI)  Objective: BP 115/80 (BP Location: Right Arm, Patient Position: Sitting, Cuff Size: Large)   Pulse 63   Temp 98.2 F (36.8 C)   Resp 20   Ht 5' 3.75" (1.619 m)   Wt 193 lb (87.5 kg)   LMP 05/31/2018   SpO2 97%   BMI 33.39 kg/m  Gen: Afebrile. No acute distress. Nontoxic in appearance, well-developed, well-nourished,  Obese, very pleasant AAF. HENT: AT. [place]. Bilateral TM visualized and normal in appearance, with the exception of l reddish blush behind left TM, normal external auditory canal. MMM, no oral lesions, adequate dentition. Bilateral nares within normal limits. Throat without erythema, ulcerations or exudates. no Cough on exam, no hoarseness on exam. Eyes:Pupils Equal Round Reactive to light, Extraocular movements intact,  Conjunctiva without redness,  discharge or icterus. Neck/lymp/endocrine: Supple,no lymphadenopathy, mildly enlarged thyromegaly CV: RRR no murmur, no edema, +2/4 P posterior tibialis pulses. no carotid bruits. No JVD. Chest: CTAB, no wheeze, rhonchi or crackles. normal Respiratory effort. good Air movement. Abd: Soft. obese. NTND. BS present. no Masses palpated. No hepatosplenomegaly. No rebound tenderness or guarding. Skin: no rashes, purpura or petechiae. Warm and well-perfused. Skin intact. Neuro/Msk:  Normal gait. PERLA. EOMi. Alert. Oriented x3.  Cranial nerves II through XII intact. Muscle strength 5/5 upper/lower extremity. DTRs equal bilaterally. Psych: Normal affect, dress and demeanor. Normal speech. Normal thought content and judgment.  Assessment/plan: [name] Hurst is a 42 y.o. female present for est/care Thyroid disease/enlarged thyroid/Obesity (BMI 30-39.9) -  h/o thyroid disease and medication. She is uncertain what type, but she stopped taking many years ago. Asymptomatic  - consider thyroid US - TSH - T4, free - T3, free - Lipid panel - CMET Abnormal Ear exam: Abnormal reddish blush behind left TM. Pt is asymptomatic without dizziness, pain, decreased hearing or tinnitus.  - will have close follow up 1 month, if not resolved would refer to ENT for possible glomus tympanicum.  Screening for iron deficiency anemia - CBC w/Diff Screening for diabetes mellitus - HgB A1c Breast cancer screening - MM DIGITAL SCREENING BILATERAL; Future Encounter for preventive health examination - Comp Met (CMET) Patient was encouraged to exercise greater than 150 minutes a week. Patient was encouraged to choose a diet filled with fresh fruits and vegetables, and lean meats. AVS provided to patient today for education/recommendation on gender specific health and safety maintenance. Colonoscopy: no fhx, screen at 50 Mammogram: completed:09/04/2016- [facility] at [facility]. Fhx breast cancer present.  Encouraged SBE. Cervical cancer screening: Fhx of uterine and ovarian cancer. last pap: 09/04/2016, results: HRHPV, completed by: Raquel Sarna, NP Immunizations: tdap 2009 (she is checking with corporate health to see if she has had one since that time), Influenza UTD 2019 (encouraged yearly Infectious disease screening: HIV 2017 DEXA: N/A   Return in about 1 year (around 07/01/2019) for CPE.   Note is dictated utilizing voice recognition software. Although note has been proof read prior to signing, occasional typographical errors still can be missed. If any questions arise, please do not hesitate to call for verification.  Electronically signed by: Howard Pouch, DO [place]

## 2018-07-01 ENCOUNTER — Telehealth: Payer: Self-pay | Admitting: Family Medicine

## 2018-07-01 DIAGNOSIS — E049 Nontoxic goiter, unspecified: Secondary | ICD-10-CM

## 2018-07-01 DIAGNOSIS — E079 Disorder of thyroid, unspecified: Secondary | ICD-10-CM

## 2018-07-01 MED ORDER — LEVOTHYROXINE SODIUM 25 MCG PO TABS: 25.0000 ug | ORAL_TABLET | Freq: Every day | ORAL | 0 refills | Status: DC

## 2018-07-01 NOTE — Telephone Encounter (Signed)
Spoke with patient reviewed lab results and instructions. Patient verbalized understanding. Patient is willing to start thyroid medication. She will call back to schedule 8 week follow up

## 2018-07-01 NOTE — Addendum Note (Signed)
Addended by: Howard Pouch A on: 07/01/2018 12:01 PM   Modules accepted: Orders

## 2018-07-01 NOTE — Telephone Encounter (Signed)
*   Please inform patient the following information: Her labs are all normal with the following exceptions.  -Her TSH, which is 1 of her thyroid hormones is mildly abnormal.  Since it is only mildly abnormal at 5.14, we have options.  If she is having any symptoms such as difficulty with weight gain, fatigue, constipation, dry skin or heavier cycles-she may find benefit to starting low-dose thyroid supplement.  If she chooses to do this I will call the medication in and she will follow-up in 8 weeks with a provider appointment for recheck.   -The other option is if she is not having any symptoms, we can continue to monitor her thyroid more routinely, such as every 6 months and when or if she develops symptoms. -Would recommend we complete an ultrasound of her thyroid since it did feel mildly enlarged on exam and her thyroid hormones seems to fluctuate history.  I have placed that order to be completed at [facility] for her.  We will call her with the results once we receive them.  -In addition, would like to recheck the abnormality of her left ear for resolution in 4-8 weeks.  If she does elect to start the thyroid medication the follow-up for both can be on the same day in 8 weeks.  If it is still present at that time, we will need to refer her to ENT for further evaluation.

## 2018-07-01 NOTE — Telephone Encounter (Signed)
Thyroid med prescribed.

## 2018-07-02 DIAGNOSIS — E041 Nontoxic single thyroid nodule: Secondary | ICD-10-CM

## 2018-07-02 HISTORY — DX: Nontoxic single thyroid nodule: E04.1

## 2018-07-05 ENCOUNTER — Encounter (HOSPITAL_BASED_OUTPATIENT_CLINIC_OR_DEPARTMENT_OTHER): Payer: Self-pay

## 2018-07-05 ENCOUNTER — Ambulatory Visit (HOSPITAL_BASED_OUTPATIENT_CLINIC_OR_DEPARTMENT_OTHER)
Admission: RE | Admit: 2018-07-05 | Discharge: 2018-07-05 | Disposition: A | Discharge status: Home / Self Care | Payer: 59 | Source: Ambulatory Visit | Attending: Family Medicine | Admitting: Family Medicine

## 2018-07-05 DIAGNOSIS — Z1231 Encounter for screening mammogram for malignant neoplasm of breast: Secondary | ICD-10-CM | POA: Diagnosis not present

## 2018-07-05 DIAGNOSIS — Z1239 Encounter for other screening for malignant neoplasm of breast: Secondary | ICD-10-CM | POA: Diagnosis present

## 2018-07-06 ENCOUNTER — Other Ambulatory Visit: Payer: 59

## 2018-07-08 ENCOUNTER — Encounter: Payer: Self-pay | Admitting: Family Medicine

## 2018-07-08 ENCOUNTER — Ambulatory Visit
Admission: RE | Admit: 2018-07-08 | Discharge: 2018-07-08 | Disposition: A | Discharge status: Home / Self Care | Payer: 59 | Source: Ambulatory Visit | Attending: Family Medicine | Admitting: Family Medicine

## 2018-07-08 DIAGNOSIS — E079 Disorder of thyroid, unspecified: Secondary | ICD-10-CM

## 2018-07-08 DIAGNOSIS — E049 Nontoxic goiter, unspecified: Secondary | ICD-10-CM

## 2018-07-08 DIAGNOSIS — E0590 Thyrotoxicosis, unspecified without thyrotoxic crisis or storm: Secondary | ICD-10-CM | POA: Diagnosis not present

## 2018-07-12 ENCOUNTER — Telehealth: Payer: Self-pay | Admitting: Family Medicine

## 2018-07-12 NOTE — Telephone Encounter (Signed)
Patient dropped off Health Provider Screening Form. Patient requests that we fax the form to the [facility] and mail a copy to the patient. Thanks

## 2018-07-12 NOTE — Telephone Encounter (Signed)
Form completed and faxed. Copy sent to scan original mailed to patient as requested.

## 2018-08-20 ENCOUNTER — Encounter: Payer: Self-pay | Admitting: *Deleted

## 2018-08-20 ENCOUNTER — Ambulatory Visit: Payer: 59 | Admitting: Family Medicine

## 2018-08-20 ENCOUNTER — Encounter: Payer: Self-pay | Admitting: Family Medicine

## 2018-08-20 VITALS — BP 103/67 | HR 56 | Temp 98.1°F | Resp 16 | Ht 63.75 in | Wt 196.4 lb

## 2018-08-20 DIAGNOSIS — J029 Acute pharyngitis, unspecified: Secondary | ICD-10-CM

## 2018-08-20 DIAGNOSIS — B9789 Other viral agents as the cause of diseases classified elsewhere: Secondary | ICD-10-CM | POA: Diagnosis not present

## 2018-08-20 DIAGNOSIS — J069 Acute upper respiratory infection, unspecified: Secondary | ICD-10-CM | POA: Diagnosis not present

## 2018-08-20 LAB — POCT RAPID STREP A (OFFICE): RAPID STREP A SCREEN: NEGATIVE

## 2018-08-20 LAB — POC INFLUENZA A&B (BINAX/QUICKVUE)
INFLUENZA A, POC: NEGATIVE
Influenza B, POC: NEGATIVE

## 2018-08-20 MED ORDER — HYDROCODONE-HOMATROPINE 5-1.5 MG/5ML PO SYRP: ORAL_SOLUTION | ORAL | 0 refills | Status: DC

## 2018-08-20 NOTE — Patient Instructions (Signed)
Get otc generic robitussin DM OR Mucinex DM and use as directed on the packaging for cough and congestion. Use otc generic saline nasal spray 2-3 times per day to irrigate/moisturize your nasal passages.   

## 2018-08-20 NOTE — Progress Notes (Signed)
OFFICE VISIT  08/20/2018   CC:  Chief Complaint  Patient presents with  . URI    joint pain, sore throat, congestion     HPI:    Patient is a 42 y.o. African-American female who presents for ST. Onset 3 d/a ST, dry throat, nasal congestion/sinus pressure, ears itchy.  No coughing. No face pain or fever.   Theraflu.  Knee joints were achy yesterday.  No muscle aches.  No rash. Not drinking fluids very well. No n/v.  Some diarrhea after taking some OTC cold meds.  She works at a hospital.  She did get her flu shot.  Past Medical History:  Diagnosis Date  . Abnormal Pap smear of cervix    12/16 neg pap HPV HR+ 16/18 neg, 1/18 neg HPV HR + genotypes 16/18/45 neg  . 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 HPV in female   . 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 Thyroid disease     Past Surgical History:  Procedure Laterality Date  . COLPOSCOPY     1/18 LGSIL  . valvular heart repair     age [age]    Outpatient Medications Prior to Visit  Medication Sig Dispense Refill  . levothyroxine (SYNTHROID, LEVOTHROID) 25 MCG tablet Take 1 tablet (25 mcg total) by mouth daily. 90 tablet 0   No facility-administered medications prior to visit.     No Known Allergies  ROS As per HPI  PE: Blood pressure 103/67, pulse (!) 56, temperature 98.1 F (36.7 C), temperature source Oral, resp. rate 16, height 5' 3.75" (1.619 m), weight 196 lb 6 oz (89.1 kg), SpO2 98 %. VS: noted--normal. Gen: alert, NAD, NONTOXIC APPEARING. HEENT: eyes without injection, drainage, or swelling.  Ears: EACs clear, TMs with normal light reflex and landmarks.  Nose: Clear rhinorrhea, with some dried, crusty exudate adherent to mildly injected mucosa.  No purulent d/c.  No paranasal sinus TTP.  No facial swelling.  Throat and mouth without focal lesion.  Mild tonsillar, soft palate, and uvula swelling and mild erythema in these regions  as well.  No exudate or asymmetry. Neck: supple, no LAD.   LUNGS: CTA bilat, nonlabored resps.   CV: RRR, no m/r/g. EXT: no c/c/e SKIN: no rash  LABS:    Chemistry      Component Value Date/Time   NA 139 06/30/2018 0919   K 4.1 06/30/2018 0919   CL 105 06/30/2018 0919   CO2 26 06/30/2018 0919   BUN 10 06/30/2018 0919   CREATININE 0.77 06/30/2018 0919      Component Value Date/Time   CALCIUM 9.2 06/30/2018 0919   ALKPHOS 66 06/30/2018 0919   AST 12 06/30/2018 0919   ALT 12 06/30/2018 0919   BILITOT 0.4 06/30/2018 0919     Rapid strep today: NEG  Rapid Flu A/B today: NEG  IMPRESSION AND PLAN:  URI with ST/cough. Viral etiology suspected. Symptomatic care discussed. Get otc generic robitussin DM OR Mucinex DM and use as directed on the packaging for cough and congestion. Use otc generic saline nasal spray 2-3 times per day to irrigate/moisturize your nasal passages. Hycodan syrup, 1-2 tsp bid prn, #166ml. Note excusing her from work today.  An After Visit Summary was printed and given to the patient.  FOLLOW UP: Return if symptoms worsen or fail to improve.  Signed:  Crissie Sickles, MD  08/20/2018     

## 2018-09-01 NOTE — L&D Delivery Note (Signed)
Delivery Note At 12:31 PM a viable and healthy female was delivered via Vaginal, Spontaneous (Presentation: LOA  ).  APGAR: 8, 9; weight  pending.   Placenta status: spontaneous, intact.  Cord:spontaneous  with the following complications: none.  Cord pH: na  Anesthesia:  epidural Episiotomy:  na Lacerations:  na Suture Repair: na Est. Blood Loss (mL):  100  Mom to postpartum.  Baby to Couplet care / Skin to Skin.  Loghan Kurtzman J 08/01/2019, 12:42 PM

## 2018-09-06 ENCOUNTER — Encounter: Payer: Self-pay | Admitting: Family Medicine

## 2018-09-06 ENCOUNTER — Other Ambulatory Visit: Payer: Self-pay

## 2018-09-06 ENCOUNTER — Ambulatory Visit (INDEPENDENT_AMBULATORY_CARE_PROVIDER_SITE_OTHER): Payer: 59 | Admitting: Family Medicine

## 2018-09-06 VITALS — BP 110/77 | HR 71 | Temp 98.5°F | Resp 14 | Ht 64.0 in | Wt 197.0 lb

## 2018-09-06 DIAGNOSIS — E041 Nontoxic single thyroid nodule: Secondary | ICD-10-CM | POA: Insufficient documentation

## 2018-09-06 DIAGNOSIS — Z01118 Encounter for examination of ears and hearing with other abnormal findings: Secondary | ICD-10-CM | POA: Diagnosis not present

## 2018-09-06 DIAGNOSIS — E039 Hypothyroidism, unspecified: Secondary | ICD-10-CM | POA: Diagnosis not present

## 2018-09-06 NOTE — Progress Notes (Signed)
[name] Hurst , Dec 17, 1975, 43 y.o., female MRN: [id number] Patient Care Team    Relationship Specialty Notifications Start End  Ma Hillock, DO PCP - General Family Medicine  06/30/18   Kem Boroughs, [place]  Gynecology  06/30/18     Chief Complaint  Patient presents with  . Hypothyroidism     Subjective: Pt presents for an OV   Hypothyroidism, unspecified type/thyroid cyst Taking levothyroxine 25 mcg QD. No side effects to reports. Does not feel much different. Still having weight gain.  [place] of thyroid was reassuring. Small cysts measuring 0.5 cm or less bilaterally do not meet criteria for biopsy nor follow-up. Subcentimeter short axis diameter lymph nodes are also noted.  Left ear TM mass:  Asymptomatic. She has occasional allergy symptoms. No hearing loss or tinnitus reported.   Depression screen PHQ 2/9 06/30/2018  Decreased Interest 0  Down, Depressed, Hopeless 0  PHQ - 2 Score 0    No Known Allergies Social History   Tobacco Use  . Smoking status: Never Smoker  . Smokeless tobacco: Never Used  Substance Use Topics  . Alcohol use: Yes    Alcohol/week: 0.0 standard drinks    Comment: occ   Past Medical History:  Diagnosis Date  . Abnormal Pap smear of cervix    12/16 neg pap HPV HR+ 16/18 neg, 1/18 neg HPV HR + genotypes 16/18/45 neg  . 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 HPV in female   . 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 Thyroid disease    Past Surgical History:  Procedure Laterality Date  . COLPOSCOPY     1/18 LGSIL  . valvular heart repair     age 45-10   Family History  Problem Relation Age of Onset  . Hypertension Mother   . Diabetes Mother   . Uterine cancer Mother   . Hypertension Father   . Diabetes Sister   . Breast cancer Paternal Aunt   . Diabetes Maternal Grandmother   . Ovarian cancer Sister    Allergies as of 09/06/2018   No Known  Allergies     Medication List       Accurate as of September 06, 2018  8:24 AM. Always use your most recent med list.        levothyroxine 25 MCG tablet Commonly known as:  SYNTHROID, LEVOTHROID Take 1 tablet (25 mcg total) by mouth daily.       All past medical history, surgical history, allergies, family history, immunizations andmedications were updated in the EMR today and reviewed under the history and medication portions of their EMR.     ROS: Negative, with the exception of above mentioned in HPI   Objective:  BP 110/77   Pulse 71   Temp 98.5 F (36.9 C) (Oral)   Resp 14   Ht 5\' 4"  (1.626 m)   Wt 197 lb (89.4 kg)   LMP  (LMP Unknown) Comment: it was in Nov  SpO2 96%   BMI 33.81 kg/m  Body mass index is 33.81 kg/m. Gen: Afebrile. No acute distress. Nontoxic in appearance, well developed, well nourished.  HENT: AT. [place]. Bilateral TM visualized, left TM with small red lesion just left of center.  MMM. Eyes:Pupils Equal Round Reactive to light, Extraocular movements intact,  Conjunctiva without redness, discharge or icterus. Neck/lymp/endocrine: Supple,no  Lymphadenopathy, mild thyromegaly (chronic) CV: RRR no murmur, no  edema Neuro: Normal gait. PERLA. EOMi. Alert. Oriented x3    No exam data present No results found. No results found for this or any previous visit (from the past 24 hour(s)).  Assessment/Plan: [name] Hurst is a 43 y.o. female present for OV for  Hypothyroidism, unspecified type/thyroid cyst - retest labs today- will refill med once labs received to ensure proper dose.  Small cysts measuring 0.5 cm or less bilaterally do not meet criteria for biopsy nor follow-up.  - TSH - T4, free - Thyroid peroxidase antibody - f/u dependent on lab result.   Ear anatomy: - ENT referral placed today for opinion of red lesion still present on left TM.    Reviewed expectations re: course of current medical issues.  Discussed self-management of  symptoms.  Outlined signs and symptoms indicating need for more acute intervention.  Patient verbalized understanding and all questions were answered.  Patient received an After-Visit Summary.    Orders Placed This Encounter  Procedures  . TSH  . T4, free  . Thyroid peroxidase antibody  . Ambulatory referral to ENT     Note is dictated utilizing voice recognition software. Although note has been proof read prior to signing, occasional typographical errors still can be missed. If any questions arise, please do not hesitate to call for verification.   electronically signed by:  Howard Pouch, DO  [place]

## 2018-09-06 NOTE — Patient Instructions (Signed)
I will refill your thyroid med tomorrow after results received.  I have referred you to ENT for your ear- they will call you to schedule.    Hypothyroidism  Hypothyroidism is when the thyroid gland does not make enough of certain hormones (it is underactive). The thyroid gland is a small gland located in the lower front part of the neck, just in front of the windpipe (trachea). This gland makes hormones that help control how the body uses food for energy (metabolism) as well as how the heart and brain function. These hormones also play a role in keeping your bones strong. When the thyroid is underactive, it produces too little of the hormones thyroxine (T4) and triiodothyronine (T3). What are the causes? This condition may be caused by:  Hashimoto's disease. This is a disease in which the body's disease-fighting system (immune system) attacks the thyroid gland. This is the most common cause.  Viral infections.  Pregnancy.  Certain medicines.  Birth defects.  Past radiation treatments to the head or neck for cancer.  Past treatment with radioactive iodine.  Past exposure to radiation in the environment.  Past surgical removal of part or all of the thyroid.  Problems with a gland in the center of the brain (pituitary gland).  Lack of enough iodine in the diet. What increases the risk? You are more likely to develop this condition if:  You are female.  You have a family history of thyroid conditions.  You use a medicine called lithium.  You take medicines that affect the immune system (immunosuppressants). What are the signs or symptoms? Symptoms of this condition include:  Feeling as though you have no energy (lethargy).  Not being able to tolerate cold.  Weight gain that is not explained by a change in diet or exercise habits.  Lack of appetite.  Dry skin.  Coarse hair.  Menstrual irregularity.  Slowing of thought processes.  Constipation.  Sadness or  depression. How is this diagnosed? This condition may be diagnosed based on:  Your symptoms, your medical history, and a physical exam.  Blood tests. You may also have imaging tests, such as an ultrasound or MRI. How is this treated? This condition is treated with medicine that replaces the thyroid hormones that your body does not make. After you begin treatment, it may take several weeks for symptoms to go away. Follow these instructions at home:  Take over-the-counter and prescription medicines only as told by your health care provider.  If you start taking any new medicines, tell your health care provider.  Keep all follow-up visits as told by your health care provider. This is important. ? As your condition improves, your dosage of thyroid hormone medicine may change. ? You will need to have blood tests regularly so that your health care provider can monitor your condition. Contact a health care provider if:  Your symptoms do not get better with treatment.  You are taking thyroid replacement medicine and you: ? Sweat a lot. ? Have tremors. ? Feel anxious. ? Lose weight rapidly. ? Cannot tolerate heat. ? Have emotional swings. ? Have diarrhea. ? Feel weak. Get help right away if you have:  Chest pain.  An irregular heartbeat.  A rapid heartbeat.  Difficulty breathing. Summary  Hypothyroidism is when the thyroid gland does not make enough of certain hormones (it is underactive).  When the thyroid is underactive, it produces too little of the hormones thyroxine (T4) and triiodothyronine (T3).  The most common cause is  Hashimoto's disease, a disease in which the body's disease-fighting system (immune system) attacks the thyroid gland. The condition can also be caused by viral infections, medicine, pregnancy, or past radiation treatment to the head or neck.  Symptoms may include weight gain, dry skin, constipation, feeling as though you do not have energy, and not  being able to tolerate cold.  This condition is treated with medicine to replace the thyroid hormones that your body does not make. This information is not intended to replace advice given to you by your health care provider. Make sure you discuss any questions you have with your health care provider. Document Released: 08/18/2005 Document Revised: 07/29/2017 Document Reviewed: 07/29/2017 Elsevier Interactive Patient Education  2019 [employer].

## 2018-09-06 NOTE — Addendum Note (Signed)
Addended by: Ralph Dowdy on: 09/06/2018 08:55 AM   Modules accepted: Orders

## 2018-09-07 ENCOUNTER — Other Ambulatory Visit (INDEPENDENT_AMBULATORY_CARE_PROVIDER_SITE_OTHER): Payer: 59

## 2018-09-07 DIAGNOSIS — E039 Hypothyroidism, unspecified: Secondary | ICD-10-CM | POA: Diagnosis not present

## 2018-09-07 LAB — TSH: TSH: 2.11 u[IU]/mL (ref 0.35–4.50)

## 2018-09-07 LAB — T4, FREE: FREE T4: 1 ng/dL (ref 0.60–1.60)

## 2018-09-08 ENCOUNTER — Telehealth: Payer: Self-pay | Admitting: Family Medicine

## 2018-09-08 LAB — THYROID PEROXIDASE ANTIBODY

## 2018-09-08 MED ORDER — LEVOTHYROXINE SODIUM 25 MCG PO TABS: 25.0000 ug | ORAL_TABLET | Freq: Every day | ORAL | 3 refills | Status: DC

## 2018-09-08 NOTE — Telephone Encounter (Signed)
Please inform patient the following information: Her thyroid panel is now normal. Continue medication at current dose, refills have been called in for her to her [place]. We will follow her thyroid function yearly at her physicals.

## 2018-09-08 NOTE — Telephone Encounter (Signed)
Spoke with patient to give results.   Pt stated verbal understanding.  She is aware that medication has been called in and will p/u at pharmacy.

## 2018-09-13 DIAGNOSIS — J302 Other seasonal allergic rhinitis: Secondary | ICD-10-CM | POA: Diagnosis not present

## 2018-09-13 DIAGNOSIS — J343 Hypertrophy of nasal turbinates: Secondary | ICD-10-CM | POA: Diagnosis not present

## 2018-09-13 DIAGNOSIS — D447 Neoplasm of uncertain behavior of aortic body and other paraganglia: Secondary | ICD-10-CM | POA: Diagnosis not present

## 2018-09-13 DIAGNOSIS — H73892 Other specified disorders of tympanic membrane, left ear: Secondary | ICD-10-CM | POA: Diagnosis not present

## 2018-09-13 DIAGNOSIS — H9193 Unspecified hearing loss, bilateral: Secondary | ICD-10-CM | POA: Diagnosis not present

## 2018-12-27 ENCOUNTER — Telehealth: Payer: Self-pay | Admitting: Certified Nurse Midwife

## 2018-12-27 NOTE — Telephone Encounter (Signed)
Left message to call Kaitlyn at [phone]. 

## 2018-12-27 NOTE — Telephone Encounter (Signed)
Patient has missed menes two months in a row. Patient stated that she is suspecting that she might be pregnant.

## 2018-12-29 NOTE — Telephone Encounter (Signed)
Referral faxed to [facility] as STAT to [phone]. Patient will be called directly to schedule. Patient is aware and verbalizes understanding. Will call if she has any problems scheduling appointment.

## 2018-12-29 NOTE — Telephone Encounter (Signed)
Spoke with patient. Patient states that she has not had a menses in 2 months. Took 2 UPT that are both positive. Patient denies any bleeding or pain. States she has had a lack of appetite. Denies nausea or vomiting. Patient is very worried regarding how far along she is. "I am not sure if I want to proceed with this pregnancy or terminate. I do not know what to do." Advised will review with Dr.Jertson regarding recommendations for appointment due to COVID 19 restrictions and return call.

## 2018-12-29 NOTE — Telephone Encounter (Signed)
Spoke with patient. Advised reviewed with Dr.Jertson who recommends referral to [facility] to see Dr.Taavon who can provider her with OB care or termination of pregnancy. Patient verbalizes understanding.  Spoke with [place] who request records be sent to office for review to assist with scheduling appointment. Referral to be marked as STAT.

## 2019-01-03 DIAGNOSIS — Z3490 Encounter for supervision of normal pregnancy, unspecified, unspecified trimester: Secondary | ICD-10-CM | POA: Diagnosis not present

## 2019-01-03 DIAGNOSIS — Z3201 Encounter for pregnancy test, result positive: Secondary | ICD-10-CM | POA: Diagnosis not present

## 2019-01-12 DIAGNOSIS — Z369 Encounter for antenatal screening, unspecified: Secondary | ICD-10-CM | POA: Diagnosis not present

## 2019-01-27 LAB — OB RESULTS CONSOLE ANTIBODY SCREEN: Antibody Screen: NEGATIVE

## 2019-01-27 LAB — OB RESULTS CONSOLE HIV ANTIBODY (ROUTINE TESTING): HIV: NONREACTIVE

## 2019-01-27 LAB — OB RESULTS CONSOLE ABO/RH: RH Type: POSITIVE

## 2019-01-27 LAB — OB RESULTS CONSOLE GC/CHLAMYDIA
Chlamydia: NEGATIVE
Gonorrhea: NEGATIVE

## 2019-01-27 LAB — OB RESULTS CONSOLE HEPATITIS B SURFACE ANTIGEN: Hepatitis B Surface Ag: NEGATIVE

## 2019-01-27 LAB — OB RESULTS CONSOLE RUBELLA ANTIBODY, IGM: Rubella: IMMUNE

## 2019-01-27 LAB — OB RESULTS CONSOLE RPR: RPR: NONREACTIVE

## 2019-07-08 ENCOUNTER — Telehealth: Payer: Self-pay

## 2019-07-08 ENCOUNTER — Ambulatory Visit (INDEPENDENT_AMBULATORY_CARE_PROVIDER_SITE_OTHER): Payer: 59 | Admitting: Family Medicine

## 2019-07-08 ENCOUNTER — Other Ambulatory Visit: Payer: Self-pay

## 2019-07-08 ENCOUNTER — Encounter: Payer: Self-pay | Admitting: Family Medicine

## 2019-07-08 ENCOUNTER — Telehealth: Payer: Self-pay | Admitting: Family Medicine

## 2019-07-08 VITALS — BP 105/66 | HR 79 | Temp 98.2°F | Resp 18 | Ht 64.5 in | Wt 201.0 lb

## 2019-07-08 DIAGNOSIS — Z130 Encounter for screening for diseases of the blood and blood-forming organs and certain disorders involving the immune mechanism: Secondary | ICD-10-CM

## 2019-07-08 DIAGNOSIS — Z9889 Other specified postprocedural states: Secondary | ICD-10-CM | POA: Diagnosis not present

## 2019-07-08 DIAGNOSIS — Z0001 Encounter for general adult medical examination with abnormal findings: Secondary | ICD-10-CM

## 2019-07-08 DIAGNOSIS — R609 Edema, unspecified: Secondary | ICD-10-CM | POA: Diagnosis not present

## 2019-07-08 DIAGNOSIS — Z0000 Encounter for general adult medical examination without abnormal findings: Secondary | ICD-10-CM | POA: Diagnosis not present

## 2019-07-08 DIAGNOSIS — E039 Hypothyroidism, unspecified: Secondary | ICD-10-CM

## 2019-07-08 DIAGNOSIS — Z131 Encounter for screening for diabetes mellitus: Secondary | ICD-10-CM | POA: Diagnosis not present

## 2019-07-08 DIAGNOSIS — R011 Cardiac murmur, unspecified: Secondary | ICD-10-CM | POA: Insufficient documentation

## 2019-07-08 DIAGNOSIS — Z3A36 36 weeks gestation of pregnancy: Secondary | ICD-10-CM

## 2019-07-08 LAB — CBC
HCT: 30.8 % — ABNORMAL LOW (ref 36.0–46.0)
Hemoglobin: 10.3 g/dL — ABNORMAL LOW (ref 12.0–15.0)
MCHC: 33.5 g/dL (ref 30.0–36.0)
MCV: 93.1 fl (ref 78.0–100.0)
Platelets: 206 10*3/uL (ref 150.0–400.0)
RBC: 3.31 Mil/uL — ABNORMAL LOW (ref 3.87–5.11)
RDW: 14.2 % (ref 11.5–15.5)
WBC: 5.5 10*3/uL (ref 4.0–10.5)

## 2019-07-08 LAB — LIPID PANEL
Cholesterol: 183 mg/dL (ref 0–200)
HDL: 68.6 mg/dL (ref >39.00)
LDL Cholesterol: 88 mg/dL (ref 0–99)
NonHDL: 113.94
Total CHOL/HDL Ratio: 3
Triglycerides: 132 mg/dL (ref 0.0–149.0)
VLDL: 26.4 mg/dL (ref 0.0–40.0)

## 2019-07-08 LAB — COMPREHENSIVE METABOLIC PANEL
ALT: 9 U/L (ref 0–35)
AST: 11 U/L (ref 0–37)
Albumin: 3.1 g/dL — ABNORMAL LOW (ref 3.5–5.2)
Alkaline Phosphatase: 74 U/L (ref 39–117)
BUN: 3 mg/dL — ABNORMAL LOW (ref 6–23)
CO2: 22 mEq/L (ref 19–32)
Calcium: 8.5 mg/dL (ref 8.4–10.5)
Chloride: 106 mEq/L (ref 96–112)
Creatinine, Ser: 0.46 mg/dL (ref 0.40–1.20)
GFR: 147.72 mL/min (ref >60.00)
Glucose, Bld: 99 mg/dL (ref 70–99)
Potassium: 3.9 mEq/L (ref 3.5–5.1)
Sodium: 138 mEq/L (ref 135–145)
Total Bilirubin: 0.4 mg/dL (ref 0.2–1.2)
Total Protein: 5.4 g/dL — ABNORMAL LOW (ref 6.0–8.3)

## 2019-07-08 LAB — HEMOGLOBIN A1C: Hgb A1c MFr Bld: 5.2 % (ref 4.6–6.5)

## 2019-07-08 LAB — TSH: TSH: 2.28 u[IU]/mL (ref 0.35–4.50)

## 2019-07-08 NOTE — Progress Notes (Signed)
Patient ID: [name] Hurst, female  DOB: 09/20/1975, 43 y.o.   MRN: [id number] Patient Care Team    Relationship Specialty Notifications Start End  Ma Hillock, DO PCP - General Family Medicine  06/30/18   Kem Boroughs, [facility]  Gynecology  06/30/18   Helayne Seminole, MD Consulting Physician Otolaryngology  09/15/18     Chief Complaint  Patient presents with  . Annual Exam    Not fasting. Mammogram 07/2018, pap smear 2018 by GYN, Dr Edwyna Ready, Pts due date is 08/05/2019    Subjective:  [name] Hurst is a 43 y.o.  Female  present for CPE. All past medical history, surgical history, allergies, family history, immunizations, medications and social history were updated in the electronic medical record today. All recent labs, ED visits and hospitalizations within the last year were reviewed.  Hypothyroidism, unspecified type/thyroid cyst Compliance with levothyroxine 25 mcg QD. No side effects to reports.  [place] of thyroid was reassuring. Small cysts measuring 0.5 cm or less bilaterally do not meet criteria for biopsy nor follow-up. Subcentimeter short axis diameter lymph nodes are also noted  [id number] at 73w0dplanning on vaginal delivery. Feels she is overall doing well. She hs routine prenatal care. Taking a PNV. She has noticed bilateral pitting edema of lower ext. She denies chest pain, dizziness, syncope or more shortness of breath than she recalls with prior pregnancy at this stage.  Health maintenance:  Colonoscopy: no fhx, screen at 50 Mammogram: completed:09/04/2016- [facility] Cervical cancer screening: last pap: 09/04/2016, completed by: GRaquel Sarna NP Immunizations: tdap UTD 2020, Influenza UTD 2020 (encouraged yearly Infectious disease screening: HIV 2017 DEXA: N/A Assistive device: none Oxygen uIRJ:JOACPatient has a Dental home. Hospitalizations/ED visits: reviewed   [facility] 07/08/2019 06/30/2018  Decreased Interest 0 0   Down, Depressed, Hopeless 0 0  PHQ - 2 Score 0 0   No flowsheet data found.   Immunization History  Administered Date(s) Administered  . Hepatitis A 06/27/2009  . Influenza-Unspecified 06/14/2019  . Td 10/31/2007  . Typhoid Live 06/27/2009  . Varicella 10/31/2007  . Yellow Fever 06/27/2009    Past Medical History:  Diagnosis Date  . Abnormal Pap smear of cervix    12/16 neg pap HPV HR+ 16/18 neg, 1/18 neg HPV HR + genotypes 16/18/45 neg  . 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 HPV in female   . 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 Thyroid disease    No Known Allergies Past Surgical History:  Procedure Laterality Date  . COLPOSCOPY     1/18 LGSIL  . valvular heart repair     age 62106-10  Family History  Problem Relation Age of Onset  . Hypertension Mother   . Diabetes Mother   . Uterine cancer Mother   . Hypertension Father   . Diabetes Sister   . Breast cancer Paternal Aunt   . Diabetes Maternal Grandmother   . Ovarian cancer Sister    Social History   Social History Narrative   Marital status/children/pets: Married, 2 children. [place]    Education/employment: Some college, works at [employer]      -Wears a bicycle helmet riding a bike: Yes     -smoke alarm in the home:Yes     - wears seatbelt:  Yes     - Feels safe in their relationships: Yes    Allergies as of 07/08/2019   No Known Allergies     Medication List       Accurate as of July 08, 2019  2:20 PM. If you have any questions, ask your nurse or doctor.        levothyroxine 25 MCG tablet Commonly known as: SYNTHROID Take 1 tablet (25 mcg total) by mouth daily.   multivitamin-prenatal 27-0.8 MG Tabs tablet Take by mouth.   Vitafol Ultra 29-0.6-0.4-200 MG Caps Take 1 capsule by mouth daily.       All past medical  history, surgical history, allergies, family history, immunizations andmedications were updated in the EMR today and reviewed under the history and medication portions of their EMR.     No results found for this or any previous visit (from the past 2160 hour(s)).   ROS: 14 pt review of systems performed and negative (unless mentioned in an HPI)  Objective: BP 105/66 (BP Location: Left Arm, Patient Position: Sitting, Cuff Size: Normal)   Pulse 79   Temp 98.2 F (36.8 C) (Temporal)   Resp 18   Ht 5' 4.5" (1.638 m)   Wt 201 lb (91.2 kg)   LMP  (LMP Unknown) Comment: it was in Nov  SpO2 98%   BMI 33.97 kg/m  Gen: Afebrile. No acute distress. Nontoxic in appearance, well-developed, well-nourished,  Very pleasant AAF. Gravid.  HENT: AT. [place]. Bilateral TM visualized and unchanged in appearance (left red round lesion TM), normal external auditory canal. MMM, no oral lesions, adequate dentition. Bilateral nares within normal limits. Throat without erythema, ulcerations or exudates. no Cough on exam, no hoarseness on exam. Eyes:Pupils Equal Round Reactive to light, Extraocular movements intact,  Conjunctiva without redness, discharge or icterus. Neck/lymp/endocrine: Supple,no lymphadenopathy, no thyromegaly CV: RRR 2/6 SM appreciated today, +1 edema bilateral Chest: CTAB, no wheeze, rhonchi or crackles. normal Respiratory effort. good Air movement. Abd: Soft. gravid. NTND. BS present. no Masses palpated. No hepatosplenomegaly. No rebound tenderness or guarding. Skin: no rashes, purpura or petechiae. Warm and well-perfused. Skin intact. Neuro/Msk:  Normal gait. PERLA. EOMi. Alert. Oriented x3.   Psych: Normal affect, dress and demeanor. Normal speech. Normal thought content and judgment.  No exam data present  Assessment/plan: [name] Hurst is a 43 y.o. female present for CPE Hypothyroidism, unspecified type Compliance with low dose levothyroxine. Refills will be provided after labs  today.  - Comp Met (CMET) - Lipid panel - TSH Screening for deficiency anemia - CBC Diabetes mellitus screening - HgB A1c S/P heart valve repair/Murmur, cardiac/ Edema, unspecified type/[redacted] weeks gestation of pregnancy New significant murmur appreciated today. She has a h/o valve repair as a child (no records available). She is [redacted] weeks pregnant at age 74 and is high risk pregnancy for her age alone. She is also having pitting edema.  Discussed with her today I have never appreciated a murmur on her and per review of EMR I do not see evidence other providers have either. Given her history, age, pregnancy and edema we need to move quickly to get her evaluated by cardiology prior to her delivery to ensure her valvular disease has not progressed and she can handle the strain of a vaginal delivery. This is her 3rd child.  - cmp, cbc - Ambulatory referral to Cardiology- urgent.  - will update OB  Encounter for general adult medical examination with abnormal findings Patient was encouraged to exercise greater than  150 minutes a week. Patient was encouraged to choose a diet filled with fresh fruits and vegetables, and lean meats. AVS provided to patient today for education/recommendation on gender specific health and safety maintenance. Colonoscopy: no fhx, screen at 63 Mammogram: completed:09/04/2016- [facility] Cervical cancer screening: last pap: 09/04/2016, completed by: Raquel Sarna, NP Immunizations: tdap UTD 2020, Influenza UTD 2020 (encouraged yearly Infectious disease screening: HIV 2017 DEXA: N/A  Return in about 1 year (around 07/07/2020) for CPE (30 min).   Annual physical completed today as well as > 15 minutes spent with patient, > 50% of that time face to face on new problem.    Electronically signed by: Howard Pouch, DO [place]

## 2019-07-08 NOTE — Telephone Encounter (Signed)
Patient brought Data health screen form for insurance. Pt request this be faxed when completed. On my desk waiting for results.

## 2019-07-08 NOTE — Telephone Encounter (Signed)
Please inform patient the following information: Labs are normal for a pregnant female, with the exception of her blood counts.  She is more anemic with a hemoglobin of 10.3 and a hematocrit of 30.8.  I would encourage her to inform her OB, they may want to add extra iron to her prenatal. I have refilled her thyroid medication. She was referred to cardiology for her murmur.  I have forwarded my office note to her obstetrician as well. Follow-up in 1 year, unless needed sooner.  Good luck on her delivery!

## 2019-07-08 NOTE — Patient Instructions (Addendum)
We will call you with results and either order a echo/study of your heart or refer you to cardiology. We will let you know on call back which. I want to do whichever is the quickest to get you evaluated.   Health Maintenance, Female Adopting a healthy lifestyle and getting preventive care are important in promoting health and wellness. Ask your health care provider about:  The right schedule for you to have regular tests and exams.  Things you can do on your own to prevent diseases and keep yourself healthy. What should I know about diet, weight, and exercise? Eat a healthy diet   Eat a diet that includes plenty of vegetables, fruits, low-fat dairy products, and lean protein.  Do not eat a lot of foods that are high in solid fats, added sugars, or sodium. Maintain a healthy weight Body mass index (BMI) is used to identify weight problems. It estimates body fat based on height and weight. Your health care provider can help determine your BMI and help you achieve or maintain a healthy weight. Get regular exercise Get regular exercise. This is one of the most important things you can do for your health. Most adults should:  Exercise for at least 150 minutes each week. The exercise should increase your heart rate and make you sweat (moderate-intensity exercise).  Do strengthening exercises at least twice a week. This is in addition to the moderate-intensity exercise.  Spend less time sitting. Even light physical activity can be beneficial. Watch cholesterol and blood lipids Have your blood tested for lipids and cholesterol at 42 years of age, then have this test every 5 years. Have your cholesterol levels checked more often if:  Your lipid or cholesterol levels are high.  You are older than 43 years of age.  You are at high risk for heart disease. What should I know about cancer screening? Depending on your health history and family history, you may need to have cancer screening at  various ages. This may include screening for:  Breast cancer.  Cervical cancer.  Colorectal cancer.  Skin cancer.  Lung cancer. What should I know about heart disease, diabetes, and high blood pressure? Blood pressure and heart disease  High blood pressure causes heart disease and increases the risk of stroke. This is more likely to develop in people who have high blood pressure readings, are of African descent, or are overweight.  Have your blood pressure checked: ? Every 3-5 years if you are 73-52 years of age. ? Every year if you are 83 years old or older. Diabetes Have regular diabetes screenings. This checks your fasting blood sugar level. Have the screening done:  Once every three years after age 14 if you are at a normal weight and have a low risk for diabetes.  More often and at a younger age if you are overweight or have a high risk for diabetes. What should I know about preventing infection? Hepatitis B If you have a higher risk for hepatitis B, you should be screened for this virus. Talk with your health care provider to find out if you are at risk for hepatitis B infection. Hepatitis C Testing is recommended for:  Everyone born from [birth year] through 1965.  Anyone with known risk factors for hepatitis C. Sexually transmitted infections (STIs)  Get screened for STIs, including gonorrhea and chlamydia, if: ? You are sexually active and are younger than 43 years of age. ? You are older than 43 years of age and your  health care provider tells you that you are at risk for this type of infection. ? Your sexual activity has changed since you were last screened, and you are at increased risk for chlamydia or gonorrhea. Ask your health care provider if you are at risk.  Ask your health care provider about whether you are at high risk for HIV. Your health care provider may recommend a prescription medicine to help prevent HIV infection. If you choose to take medicine to prevent  HIV, you should first get tested for HIV. You should then be tested every 3 months for as long as you are taking the medicine. Pregnancy  If you are about to stop having your period (premenopausal) and you may become pregnant, seek counseling before you get pregnant.  Take 400 to 800 micrograms (mcg) of folic acid every day if you become pregnant.  Ask for birth control (contraception) if you want to prevent pregnancy. Osteoporosis and menopause Osteoporosis is a disease in which the bones lose minerals and strength with aging. This can result in bone fractures. If you are 40 years old or older, or if you are at risk for osteoporosis and fractures, ask your health care provider if you should:  Be screened for bone loss.  Take a calcium or vitamin D supplement to lower your risk of fractures.  Be given hormone replacement therapy (HRT) to treat symptoms of menopause. Follow these instructions at home: Lifestyle  Do not use any products that contain nicotine or tobacco, such as cigarettes, e-cigarettes, and chewing tobacco. If you need help quitting, ask your health care provider.  Do not use street drugs.  Do not share needles.  Ask your health care provider for help if you need support or information about quitting drugs. Alcohol use  Do not drink alcohol if: ? Your health care provider tells you not to drink. ? You are pregnant, may be pregnant, or are planning to become pregnant.  If you drink alcohol: ? Limit how much you use to 0-1 drink a day. ? Limit intake if you are breastfeeding.  Be aware of how much alcohol is in your drink. In the [place] one drink equals one 12 oz bottle of beer (355 mL), one 5 oz glass of wine (148 mL), or one 1 oz glass of hard liquor (44 mL). General instructions  Schedule regular health, dental, and eye exams.  Stay current with your vaccines.  Tell your health care provider if: ? You often feel depressed. ? You have ever been abused or do  not feel safe at home. Summary  Adopting a healthy lifestyle and getting preventive care are important in promoting health and wellness.  Follow your health care provider's instructions about healthy diet, exercising, and getting tested or screened for diseases.  Follow your health care provider's instructions on monitoring your cholesterol and blood pressure. This information is not intended to replace advice given to you by your health care provider. Make sure you discuss any questions you have with your health care provider. Document Released: 03/03/2011 Document Revised: 08/11/2018 Document Reviewed: 08/11/2018 Elsevier Patient Education  2020 [employer].

## 2019-07-11 NOTE — Telephone Encounter (Signed)
Faxed to number provided. Stickers placed and sent to scan.

## 2019-07-11 NOTE — Telephone Encounter (Signed)
Completed and returned to staff.  

## 2019-07-11 NOTE — Telephone Encounter (Signed)
Pt was called and given results. She verbalized understanding  

## 2019-07-11 NOTE — Telephone Encounter (Signed)
Paperwork completed. Placed on Dr Raoul Pitch desk to review and sign.

## 2019-07-13 ENCOUNTER — Other Ambulatory Visit (HOSPITAL_BASED_OUTPATIENT_CLINIC_OR_DEPARTMENT_OTHER): Payer: Self-pay | Admitting: Family Medicine

## 2019-07-13 DIAGNOSIS — Z1231 Encounter for screening mammogram for malignant neoplasm of breast: Secondary | ICD-10-CM

## 2019-07-15 ENCOUNTER — Ambulatory Visit: Payer: 59 | Admitting: Cardiology

## 2019-07-15 ENCOUNTER — Other Ambulatory Visit: Payer: Self-pay

## 2019-07-15 DIAGNOSIS — R011 Cardiac murmur, unspecified: Secondary | ICD-10-CM | POA: Diagnosis not present

## 2019-07-15 DIAGNOSIS — Z8774 Personal history of (corrected) congenital malformations of heart and circulatory system: Secondary | ICD-10-CM | POA: Diagnosis not present

## 2019-07-15 NOTE — Progress Notes (Signed)
Primary Care Provider: Ma Hillock, DO Cardiologist: No primary care provider on file. Electrophysiologist:   Clinic Note: Chief Complaint  Patient presents with  . 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 Heart Murmur    Week 37 pregnancy    HPI:    [name] Hurst is a 43 y.o. (G4 P3-0-0-2) female in her 37th week of pregnancy who is being seen today for the evaluation of HEART MURMUR AND HISTORY OF HEART SURGERY NEARING THE END OF PREGNANCY at the request of Kuneff, [place], DO.  [name] Hurst was last seen on November 6 by her PCP.  Was noted to have a 3 out of 6 heart murmur.  Was concerned because she had had a heart valve repair/cardiac surgery and therefore was referred for cardiology evaluation at 36 weeks pregnancy.  Concern for high risk pregnancy.  Apparently murmur not heard before.  --> I reviewed the chart prior to her coming in and noted that she had actually had a pediatric echocardiogram done of the fetus at [facility] to evaluate the child's heart based on her history of VSD repair (not valve repair). -->  This visit proved to be relatively reassuring.  No evidence of VSD and the baby and no valvular lesions.  Recent Hospitalizations: None, has planned hopefully vaginal delivery and December (December 4.)  Reviewed  CV studies:    The following studies were reviewed today: (if available, images/films reviewed: From Epic Chart or Care Everywhere) . Fetal echocardiogram reviewed from [place]: Fetal lie-breech, levocardia abdominal situs Saltus.  D ventricular loop.  Normal position of great vessels.  Normal systemic and pulmonary vein drainage.  Normal right atrial size normal right to left atrial shunt seen.  Normal left atrial size.  Normal tricuspid valve and mitral valve.  Echogenic focus on the posterior medial papillary muscle noted but normal motion.  Both ventricles are normal.  Both semilunar valves are normal.   Normal great vessels. o Normal fetal echocardiogram.   Interval History:   [name] Hurst presents today a little bit confused.  This is her fourth pregnancy with hopefully third child and has not had issues with AV of her previous pregnancies. She tells me that while living in [place], at age 33 or 67 she had repair of a "hole in her heart.  This was suggested to be a VSD ".  She said that she was followed by pediatric cardiologist for years while she was there, but [place] the [place] in her 53s.  She did tell me that while living in [place] several years ago she saw a cardiologist and had echocardiogram done and was told that everything looked fine.  She has not had any issues whatsoever from a cardiac standpoint as far as any dyspnea or chest tightness or pressure.  During her pregnancy she is doing fine but notes the normal swelling and shortness of breath that comes with being late in the pregnancy.  She is not had any troubles with her pregnancy.  No hypertension or eclampsia/preeclampsia.  She was referred because of a murmur that was heard, and was not aware that there was a murmur.  CV Review of Symptoms (Summary)  positive for - dyspnea on exertion, edema and Expected for this stage of pregnancy negative for - chest pain, irregular heartbeat, loss of consciousness, orthopnea, palpitations, paroxysmal nocturnal dyspnea, rapid heart rate, shortness of breath or Syncope/near syncope, TIA/amaurosis fugax, hypertension issues  The patient does not have symptoms concerning for COVID-19 infection (fever, chills, cough, or new shortness of breath).  The patient is practicing social distancing. ++ Masking.  Is safe while she goes out for groceries/shopping.   REVIEWED OF SYSTEMS   A comprehensive ROS was performed. Review of Systems  Constitutional: Positive for malaise/fatigue (I expected the at the end of pregnancy.). Negative for weight loss.  HENT: Negative for congestion  and nosebleeds.   Respiratory: Negative for cough, shortness of breath and wheezing.   Gastrointestinal: Negative for blood in stool and melena.  Genitourinary: Negative for hematuria.  Musculoskeletal: Negative for falls and joint pain.  Neurological: Positive for dizziness (Sometimes when she lies in the wrong side, but otherwise not). Negative for focal weakness and weakness.  Psychiatric/Behavioral: Negative.   All other systems reviewed and are negative.  I have reviewed and (if needed) personally updated the patient's problem list, medications, allergies, past medical and surgical history, social and family history.   PAST MEDICAL HISTORY   Past Medical History:  Diagnosis Date  . Abnormal Pap smear of cervix    12/16 neg pap HPV HR+ 16/18 neg, 1/18 neg HPV HR + genotypes 16/18/45 neg  . 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 HPV in female   . 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 S/P VSD repair 1986   While living in [place]  . Thyroid disease      PAST SURGICAL HISTORY   Past Surgical History:  Procedure Laterality Date  . COLPOSCOPY     1/18 LGSIL  . VSD REPAIR  1986   In [place]     MEDICATIONS/ALLERGIES   Current Meds  Medication Sig  . levothyroxine (SYNTHROID, LEVOTHROID) 25 MCG tablet Take 1 tablet (25 mcg total) by mouth daily.  . Prenat-Fe Poly-Methfol-FA-DHA (VITAFOL ULTRA) 29-0.6-0.4-200 MG CAPS Take 1 capsule by mouth daily.  . Prenatal Vit-Fe Fumarate-FA (MULTIVITAMIN-PRENATAL) 27-0.8 MG TABS tablet Take by mouth.    No Known Allergies   SOCIAL HISTORY/FAMILY HISTORY   Social History   Tobacco Use  . Smoking status: Never Smoker  . Smokeless tobacco: Never Used  Substance Use Topics  . Alcohol use: Yes    Alcohol/week: 0.0 standard drinks    Comment: occ  . Drug use: No   Social History   Social History Narrative   Marital status/children/pets:  Married, 2 children. She is from [place] originally and her husband from [place].    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, applications:      -Wears a bicycle helmet riding a bike: Yes     -smoke alarm in the home:Yes     - wears seatbelt: Yes     - Feels safe in their relationships: Yes    Family History family history includes Breast cancer in her paternal aunt; Diabetes in her maternal grandmother, mother, and sister; Hypertension in her father and mother; Ovarian cancer in her sister; Uterine cancer in her mother.   OBJCTIVE -PE, EKG, labs   Wt Readings from Last 3 Encounters:  07/15/19 203 lb (92.1 kg)  07/08/19 201 lb (91.2 kg)  09/06/18 197 lb (89.4 kg)    Physical Exam: BP 120/73   Pulse 88   Ht 5\' 2"  (1.575 m)   Wt 203 lb (92.1 kg)   LMP  (LMP Unknown) Comment: it was in Nov  SpO2 99%   BMI 37.13 kg/m  Physical Exam  Constitutional: She is oriented to person, place, and time. She appears well-developed and well-nourished. No distress.  Healthy-appearing pregnant woman.  No acute distress.  Cardiovascular: Normal rate, regular rhythm and intact distal pulses.  No extrasystoles are present. PMI is not displaced. Exam reveals no gallop and no friction rub.  Murmur heard. High-pitched harsh crescendo-decrescendo early systolic murmur is present with a grade of 2/6 at the upper right sternal border radiating to the neck. Pulmonary/Chest: Effort normal and breath sounds normal. No respiratory distress. She has no wheezes. She has no rales.  Abdominal:  Gravid uterus.  Did not attempt palpation.  Nontender however on light touch.  Normal active bowel sounds.  Musculoskeletal: Normal range of motion.        General: Edema (Trivial ankle and pedal edema) present.  Neurological: She is alert and oriented to person, place, and time. No cranial nerve deficit.  Psychiatric: She has a normal mood and affect. Her behavior is normal.  Judgment and thought content normal.  Vitals reviewed.    Adult ECG Report  Rate: 88 ;  Rhythm: normal sinus rhythm and Borderline right axis deviation and subtle T wave inversions.  Otherwise normal intervals durations.;   Narrative Interpretation: Borderline EKG.  Recent Labs:  Lab Results  Component Value Date   CHOL 183 07/08/2019   HDL 68.60 07/08/2019   LDLCALC 88 07/08/2019   TRIG 132.0 07/08/2019   CHOLHDL 3 07/08/2019   Lab Results  Component Value Date   CREATININE 0.46 07/08/2019   BUN 3 (L) 07/08/2019   NA 138 07/08/2019   K 3.9 07/08/2019   CL 106 07/08/2019   CO2 22 07/08/2019    ASSESSMENT/PLAN    Problem List Items Addressed This Visit    S/P VSD repair (Chronic)    History of VSD repair as a child.  Has not had any issues since.  Has been evaluated at least once here in [place] in the last 10 years and was told that there was no evidence of residual defect.  With concerning murmur heard during pregnancy, plan will be to check 2D echocardiogram.  Only if abnormal would there be concern for potential higher risk delivery.  We will try to get the echocardiogram done at the earliest available appointment.  Currently scheduled for Tuesday, November 24.      Relevant Orders   ECHOCARDIOGRAM COMPLETE   EKG 12-Lead   Ejection murmur (Chronic)    I think there may have been a little bit of confusion because she did not have valvular repair as a child.  She had a VSD repair.  Certainly there could be a systolic murmur from VSD, but it would be unlikely that it would be revealed now unless it is due to enlargement of the ventricle during pregnancy.  Most likely the murmur hearing is a flow murmur due to increased blood volume and flow through the aortic valve.  Plan: We will check 2D echocardiogram to exclude valvular disease or recurrence of VSD.      Relevant Orders   ECHOCARDIOGRAM COMPLETE   EKG 12-Lead      COVID-19 Education: The signs and  symptoms of COVID-19 were discussed with the patient and how to seek care for testing (follow up with PCP or arrange E-visit).   The importance of social distancing was discussed today.  I spent a total of 20 minutes with the patient and chart review. >  50%  of the time was spent in direct patient consultation.  Additional time spent with chart review (studies, outside notes, etc): 10 Total Time: 30 min   Current medicines are reviewed at length with the patient today.  (+/- concerns) n/a   Patient Instructions / Medication Changes & Studies & Tests Ordered   Patient Instructions  Medication Instructions:  NOT NEEDED   Lab Work: NOT NEEDED  Testing/Procedures: WILL BE SCHEDULE AT [facility] Your physician has requested that you have an echocardiogram. Echocardiography is a painless test that uses sound waves to create images of your heart. It provides your doctor with information about the size and shape of your heart and how well your heart's chambers and valves are working. This procedure takes approximately one hour. There are no restrictions for this procedure.    Follow-Up: At [facility], you and your health needs are our priority.  As part of our continuing mission to provide you with exceptional heart care, we have created designated Provider Care Teams.  These Care Teams include your primary Cardiologist (physician) and Advanced Practice Providers (APPs -  Physician Assistants and Nurse Practitioners) who all work together to provide you with the care you need, when you need it.  Your next appointment:    IF NORMAL -TEST -  FOLLOW UP AS NEEDED  The format for your next appointment:   In Person  Provider:   Glenetta Hew, MD  Other Instructions    Studies Ordered:   Orders Placed This Encounter  Procedures  . EKG 12-Lead  . ECHOCARDIOGRAM COMPLETE     Glenetta Hew, M.D., M.S. Interventional Cardiologist   Pager # [phone]  Phone # [phone] [address] [place], [place] [address]   Thank you for choosing Heartcare at [facility]!!

## 2019-07-15 NOTE — Patient Instructions (Signed)
Medication Instructions:  NOT NEEDED   Lab Work: NOT NEEDED  Testing/Procedures: WILL BE SCHEDULE AT [facility] Your physician has requested that you have an echocardiogram. Echocardiography is a painless test that uses sound waves to create images of your heart. It provides your doctor with information about the size and shape of your heart and how well your heart's chambers and valves are working. This procedure takes approximately one hour. There are no restrictions for this procedure.    Follow-Up: At [facility], you and your health needs are our priority.  As part of our continuing mission to provide you with exceptional heart care, we have created designated Provider Care Teams.  These Care Teams include your primary Cardiologist (physician) and Advanced Practice Providers (APPs -  Physician Assistants and Nurse Practitioners) who all work together to provide you with the care you need, when you need it.  Your next appointment:    IF NORMAL -TEST -  FOLLOW UP AS NEEDED  The format for your next appointment:   In Person  Provider:   Glenetta Hew, MD  Other Instructions

## 2019-07-17 ENCOUNTER — Encounter: Payer: Self-pay | Admitting: Cardiology

## 2019-07-17 NOTE — Assessment & Plan Note (Addendum)
History of VSD repair as a child.  Has not had any issues since.  Has been evaluated at least once here in [place] in the last 10 years and was told that there was no evidence of residual defect.  With concerning murmur heard during pregnancy, plan will be to check 2D echocardiogram.  Only if abnormal would there be concern for potential higher risk delivery.  We will try to get the echocardiogram done at the earliest available appointment.  Currently scheduled for Tuesday, November 24.

## 2019-07-17 NOTE — Assessment & Plan Note (Signed)
I think there may have been a little bit of confusion because she did not have valvular repair as a child.  She had a VSD repair.  Certainly there could be a systolic murmur from VSD, but it would be unlikely that it would be revealed now unless it is due to enlargement of the ventricle during pregnancy.  Most likely the murmur hearing is a flow murmur due to increased blood volume and flow through the aortic valve.  Plan: We will check 2D echocardiogram to exclude valvular disease or recurrence of VSD.

## 2019-07-19 ENCOUNTER — Other Ambulatory Visit: Payer: Self-pay

## 2019-07-19 ENCOUNTER — Ambulatory Visit (HOSPITAL_COMMUNITY): Payer: 59 | Attending: Cardiology

## 2019-07-19 DIAGNOSIS — R011 Cardiac murmur, unspecified: Secondary | ICD-10-CM | POA: Diagnosis not present

## 2019-07-19 DIAGNOSIS — Z8774 Personal history of (corrected) congenital malformations of heart and circulatory system: Secondary | ICD-10-CM

## 2019-07-21 ENCOUNTER — Encounter: Payer: Self-pay | Admitting: *Deleted

## 2019-07-22 LAB — OB RESULTS CONSOLE GBS: GBS: NEGATIVE

## 2019-07-25 ENCOUNTER — Telehealth (HOSPITAL_COMMUNITY): Payer: Self-pay | Admitting: *Deleted

## 2019-07-25 ENCOUNTER — Encounter (HOSPITAL_COMMUNITY): Payer: Self-pay | Admitting: *Deleted

## 2019-07-25 NOTE — Telephone Encounter (Signed)
Preadmission screen  

## 2019-07-26 ENCOUNTER — Other Ambulatory Visit (HOSPITAL_COMMUNITY): Payer: 59

## 2019-07-27 ENCOUNTER — Other Ambulatory Visit: Payer: Self-pay | Admitting: Obstetrics and Gynecology

## 2019-07-30 ENCOUNTER — Other Ambulatory Visit (HOSPITAL_COMMUNITY)
Admission: RE | Admit: 2019-07-30 | Discharge: 2019-07-30 | Disposition: A | Discharge status: Home / Self Care | Payer: 59 | Source: Ambulatory Visit | Attending: Obstetrics and Gynecology | Admitting: Obstetrics and Gynecology

## 2019-07-30 DIAGNOSIS — Z20828 Contact with and (suspected) exposure to other viral communicable diseases: Secondary | ICD-10-CM | POA: Insufficient documentation

## 2019-07-30 DIAGNOSIS — Z01812 Encounter for preprocedural laboratory examination: Secondary | ICD-10-CM | POA: Insufficient documentation

## 2019-07-30 LAB — SARS CORONAVIRUS 2 (TAT 6-24 HRS): SARS Coronavirus 2: NEGATIVE

## 2019-08-01 ENCOUNTER — Other Ambulatory Visit: Payer: Self-pay

## 2019-08-01 ENCOUNTER — Encounter (HOSPITAL_COMMUNITY): Payer: Self-pay

## 2019-08-01 ENCOUNTER — Inpatient Hospital Stay (HOSPITAL_COMMUNITY): Payer: 59 | Admitting: Anesthesiology

## 2019-08-01 ENCOUNTER — Inpatient Hospital Stay (HOSPITAL_COMMUNITY): Payer: 59

## 2019-08-01 ENCOUNTER — Inpatient Hospital Stay (HOSPITAL_COMMUNITY)
Admission: AD | Admit: 2019-08-01 | Discharge: 2019-08-02 | DRG: 806 | Disposition: A | Discharge status: Home / Self Care | Payer: 59 | Attending: Obstetrics and Gynecology | Admitting: Obstetrics and Gynecology

## 2019-08-01 DIAGNOSIS — Z3A39 39 weeks gestation of pregnancy: Secondary | ICD-10-CM

## 2019-08-01 DIAGNOSIS — D62 Acute posthemorrhagic anemia: Secondary | ICD-10-CM | POA: Diagnosis not present

## 2019-08-01 DIAGNOSIS — Z3490 Encounter for supervision of normal pregnancy, unspecified, unspecified trimester: Secondary | ICD-10-CM | POA: Diagnosis present

## 2019-08-01 DIAGNOSIS — O9081 Anemia of the puerperium: Secondary | ICD-10-CM | POA: Diagnosis not present

## 2019-08-01 DIAGNOSIS — O99284 Endocrine, nutritional and metabolic diseases complicating childbirth: Secondary | ICD-10-CM | POA: Diagnosis present

## 2019-08-01 DIAGNOSIS — Z20828 Contact with and (suspected) exposure to other viral communicable diseases: Secondary | ICD-10-CM | POA: Diagnosis present

## 2019-08-01 DIAGNOSIS — O403XX0 Polyhydramnios, third trimester, not applicable or unspecified: Principal | ICD-10-CM | POA: Diagnosis present

## 2019-08-01 DIAGNOSIS — E039 Hypothyroidism, unspecified: Secondary | ICD-10-CM | POA: Diagnosis present

## 2019-08-01 LAB — CBC
HCT: 35 % — ABNORMAL LOW (ref 36.0–46.0)
Hemoglobin: 11.3 g/dL — ABNORMAL LOW (ref 12.0–15.0)
MCH: 30.9 pg (ref 26.0–34.0)
MCHC: 32.3 g/dL (ref 30.0–36.0)
MCV: 95.6 fL (ref 80.0–100.0)
Platelets: 225 10*3/uL (ref 150–400)
RBC: 3.66 MIL/uL — ABNORMAL LOW (ref 3.87–5.11)
RDW: 14.3 % (ref 11.5–15.5)
WBC: 6.8 10*3/uL (ref 4.0–10.5)
nRBC: 0 % (ref 0.0–0.2)

## 2019-08-01 LAB — RPR: RPR Ser Ql: NONREACTIVE

## 2019-08-01 LAB — TYPE AND SCREEN
ABO/RH(D): O POS
Antibody Screen: NEGATIVE

## 2019-08-01 LAB — ABO/RH: ABO/RH(D): O POS

## 2019-08-01 MED ORDER — OXYTOCIN 40 UNITS IN NORMAL SALINE INFUSION - SIMPLE MED
1.0000 m[IU]/min | INTRAVENOUS | Status: DC
  Administered 2019-08-01: 2 m[IU]/min via INTRAVENOUS
  Filled 2019-08-01: qty 1000

## 2019-08-01 MED ORDER — LACTATED RINGERS IV SOLN: 500.0000 mL | INTRAVENOUS | Status: DC | PRN

## 2019-08-01 MED ORDER — COCONUT OIL OIL: 1.0000 "application " | TOPICAL_OIL | Status: DC | PRN

## 2019-08-01 MED ORDER — TERBUTALINE SULFATE 1 MG/ML IJ SOLN: 0.2500 mg | Freq: Once | INTRAMUSCULAR | Status: DC | PRN

## 2019-08-01 MED ORDER — OXYCODONE-ACETAMINOPHEN 5-325 MG PO TABS: 1.0000 | ORAL_TABLET | ORAL | Status: DC | PRN

## 2019-08-01 MED ORDER — LIDOCAINE HCL (PF) 1 % IJ SOLN: 30.0000 mL | INTRAMUSCULAR | Status: DC | PRN

## 2019-08-01 MED ORDER — OXYCODONE-ACETAMINOPHEN 5-325 MG PO TABS: 2.0000 | ORAL_TABLET | ORAL | Status: DC | PRN

## 2019-08-01 MED ORDER — FENTANYL-BUPIVACAINE-NACL 0.5-0.125-0.9 MG/250ML-% EP SOLN
12.0000 mL/h | EPIDURAL | Status: DC | PRN
  Filled 2019-08-01: qty 250

## 2019-08-01 MED ORDER — BENZOCAINE-MENTHOL 20-0.5 % EX AERO: 1.0000 "application " | INHALATION_SPRAY | CUTANEOUS | Status: DC | PRN

## 2019-08-01 MED ORDER — TETANUS-DIPHTH-ACELL PERTUSSIS 5-2.5-18.5 LF-MCG/0.5 IM SUSP: 0.5000 mL | Freq: Once | INTRAMUSCULAR | Status: DC

## 2019-08-01 MED ORDER — PHENYLEPHRINE 40 MCG/ML (10ML) SYRINGE FOR IV PUSH (FOR BLOOD PRESSURE SUPPORT): 80.0000 ug | PREFILLED_SYRINGE | INTRAVENOUS | Status: DC | PRN

## 2019-08-01 MED ORDER — PRENATAL MULTIVITAMIN CH
1.0000 | ORAL_TABLET | Freq: Every day | ORAL | Status: DC
  Administered 2019-08-01: 1 via ORAL
  Filled 2019-08-01 (×2): qty 1

## 2019-08-01 MED ORDER — ONDANSETRON HCL 4 MG/2ML IJ SOLN: 4.0000 mg | INTRAMUSCULAR | Status: DC | PRN

## 2019-08-01 MED ORDER — EPHEDRINE 5 MG/ML INJ: 10.0000 mg | INTRAVENOUS | Status: DC | PRN

## 2019-08-01 MED ORDER — TRANEXAMIC ACID-NACL 1000-0.7 MG/100ML-% IV SOLN
INTRAVENOUS | Status: AC
  Filled 2019-08-01: qty 100

## 2019-08-01 MED ORDER — ACETAMINOPHEN 325 MG PO TABS: 650.0000 mg | ORAL_TABLET | ORAL | Status: DC | PRN

## 2019-08-01 MED ORDER — SOD CITRATE-CITRIC ACID 500-334 MG/5ML PO SOLN: 30.0000 mL | ORAL | Status: DC | PRN

## 2019-08-01 MED ORDER — OXYTOCIN BOLUS FROM INFUSION
500.0000 mL | Freq: Once | INTRAVENOUS | Status: AC
  Administered 2019-08-01: 500 mL via INTRAVENOUS

## 2019-08-01 MED ORDER — OXYTOCIN 40 UNITS IN NORMAL SALINE INFUSION - SIMPLE MED: 2.5000 [IU]/h | INTRAVENOUS | Status: DC

## 2019-08-01 MED ORDER — DIBUCAINE (PERIANAL) 1 % EX OINT: 1.0000 "application " | TOPICAL_OINTMENT | CUTANEOUS | Status: DC | PRN

## 2019-08-01 MED ORDER — LACTATED RINGERS IV SOLN
INTRAVENOUS | Status: DC
  Administered 2019-08-01 (×3): via INTRAVENOUS

## 2019-08-01 MED ORDER — ONDANSETRON HCL 4 MG/2ML IJ SOLN: 4.0000 mg | Freq: Four times a day (QID) | INTRAMUSCULAR | Status: DC | PRN

## 2019-08-01 MED ORDER — ONDANSETRON HCL 4 MG PO TABS: 4.0000 mg | ORAL_TABLET | ORAL | Status: DC | PRN

## 2019-08-01 MED ORDER — LEVOTHYROXINE SODIUM 50 MCG PO TABS
25.0000 ug | ORAL_TABLET | Freq: Every day | ORAL | Status: DC
  Administered 2019-08-01: 25 ug via ORAL
  Administered 2019-08-02: 05:00:00 via ORAL
  Filled 2019-08-01 (×2): qty 1

## 2019-08-01 MED ORDER — WITCH HAZEL-GLYCERIN EX PADS: 1.0000 "application " | MEDICATED_PAD | CUTANEOUS | Status: DC | PRN

## 2019-08-01 MED ORDER — METHYLERGONOVINE MALEATE 0.2 MG PO TABS: 0.2000 mg | ORAL_TABLET | ORAL | Status: DC | PRN

## 2019-08-01 MED ORDER — METHYLERGONOVINE MALEATE 0.2 MG/ML IJ SOLN: 0.2000 mg | INTRAMUSCULAR | Status: DC | PRN

## 2019-08-01 MED ORDER — SODIUM CHLORIDE (PF) 0.9 % IJ SOLN
INTRAMUSCULAR | Status: DC | PRN
  Administered 2019-08-01: 12 mL/h via EPIDURAL

## 2019-08-01 MED ORDER — LACTATED RINGERS IV SOLN: 500.0000 mL | Freq: Once | INTRAVENOUS | Status: DC

## 2019-08-01 MED ORDER — ZOLPIDEM TARTRATE 5 MG PO TABS: 5.0000 mg | ORAL_TABLET | Freq: Every evening | ORAL | Status: DC | PRN

## 2019-08-01 MED ORDER — IBUPROFEN 600 MG PO TABS
600.0000 mg | ORAL_TABLET | Freq: Four times a day (QID) | ORAL | Status: DC
  Administered 2019-08-01 – 2019-08-02 (×3): 600 mg via ORAL
  Filled 2019-08-01 (×4): qty 1

## 2019-08-01 MED ORDER — DIPHENHYDRAMINE HCL 25 MG PO CAPS: 25.0000 mg | ORAL_CAPSULE | Freq: Four times a day (QID) | ORAL | Status: DC | PRN

## 2019-08-01 MED ORDER — DIPHENHYDRAMINE HCL 50 MG/ML IJ SOLN: 12.5000 mg | INTRAMUSCULAR | Status: DC | PRN

## 2019-08-01 MED ORDER — SENNOSIDES-DOCUSATE SODIUM 8.6-50 MG PO TABS
2.0000 | ORAL_TABLET | ORAL | Status: DC
  Administered 2019-08-01: 2 via ORAL
  Filled 2019-08-01: qty 2

## 2019-08-01 MED ORDER — MISOPROSTOL 25 MCG QUARTER TABLET
25.0000 ug | ORAL_TABLET | ORAL | Status: DC | PRN
  Administered 2019-08-01 (×2): 25 ug via VAGINAL
  Filled 2019-08-01 (×2): qty 1

## 2019-08-01 MED ORDER — TRANEXAMIC ACID-NACL 1000-0.7 MG/100ML-% IV SOLN
1000.0000 mg | Freq: Once | INTRAVENOUS | Status: AC
  Administered 2019-08-01: 1000 mg via INTRAVENOUS

## 2019-08-01 MED ORDER — LIDOCAINE HCL (PF) 1 % IJ SOLN
INTRAMUSCULAR | Status: DC | PRN
  Administered 2019-08-01: 10 mL via EPIDURAL

## 2019-08-01 MED ORDER — SIMETHICONE 80 MG PO CHEW: 80.0000 mg | CHEWABLE_TABLET | ORAL | Status: DC | PRN

## 2019-08-01 NOTE — Progress Notes (Signed)
[name] Hurst is a 43 y.o. [id number] at [redacted]w[redacted]d by LMP admitted for induction of labor due to [facility] and polyhydramnios.  Subjective: uncomfortable  Objective: BP (!) 110/49   Pulse (!) 58   Temp 97.8 F (36.6 C) (Oral)   Resp 20   Ht 5\' 3"  (1.6 m)   Wt 92.2 kg   LMP  (LMP Unknown) Comment: it was in Nov  BMI 36.00 kg/m  No intake/output data recorded. No intake/output data recorded.  FHT:  FHR: 145 bpm, variability: moderate,  accelerations:  Present,  decelerations:  Absent UC:   regular, every 2-4 minutes SVE:   Dilation: 2.5 Effacement (%): 60 Station: -3 Exam by:: Shana Zavaleta  Labs: Lab Results  Component Value Date   WBC 6.8 08/01/2019   HGB 11.3 (L) 08/01/2019   HCT 35.0 (L) 08/01/2019   MCV 95.6 08/01/2019   PLT 225 08/01/2019    Assessment / Plan: Induction of labor due to [place] and polyhydramnios,  progressing well on pitocin  Labor: Progressing normally Preeclampsia:  no signs or symptoms of toxicity Fetal Wellbeing:  Category I Pain Control:  Labor support without medications I/D:  n/a Anticipated MOD:  giuarded  Gracyn Santillanes J 08/01/2019, 8:26 AM

## 2019-08-01 NOTE — Anesthesia Postprocedure Evaluation (Signed)
Anesthesia Post Note  Patient: [name] Hurst  Procedure(s) Performed: AN AD HOC LABOR EPIDURAL     Patient location during evaluation: Mother Baby Anesthesia Type: Epidural Level of consciousness: awake Pain management: satisfactory to patient Vital Signs Assessment: post-procedure vital signs reviewed and stable Respiratory status: spontaneous breathing Cardiovascular status: stable Anesthetic complications: no    Last Vitals:  Vitals:   08/01/19 1515 08/01/19 1917  BP: 114/63 (!) 115/54  Pulse: 72 61  Resp: 16 18  Temp: 37.2 C 37.4 C  SpO2: 99%     Last Pain:  Vitals:   08/01/19 1917  TempSrc: Oral  PainSc:    Pain Goal:                   [employer]

## 2019-08-01 NOTE — Anesthesia Procedure Notes (Signed)
Epidural Patient location during procedure: OB Start time: 08/01/2019 8:42 AM End time: 08/01/2019 9:03 AM  Staffing Anesthesiologist: Lidia Collum, MD Performed: anesthesiologist   Preanesthetic Checklist Completed: patient identified, pre-op evaluation, timeout performed, IV checked, risks and benefits discussed and monitors and equipment checked  Epidural Patient position: sitting Prep: DuraPrep Patient monitoring: heart rate, continuous pulse ox and blood pressure Approach: left paramedian Location: L3-L4 Injection technique: LOR air  Needle:  Needle type: Tuohy  Needle gauge: 17 G Needle length: 9 cm Needle insertion depth: 8 cm Catheter type: closed end flexible Catheter size: 19 Gauge Catheter at skin depth: 13 cm Test dose: negative  Assessment Events: blood not aspirated, injection not painful, no injection resistance, negative IV test and no paresthesia  Additional Notes Reason for block:procedure for pain

## 2019-08-01 NOTE — H&P (Signed)
[name] Hurst is a 43 y.o. female presenting for IOL for AMA and polyhydramnios. OB History    Gravida  4   Para  3   Term  3   Preterm      AB      Living  2     SAB      TAB      Ectopic      Multiple      Live Births  3          Past Medical History:  Diagnosis Date  . Abnormal Pap smear of cervix    12/16 neg pap HPV HR+ 16/18 neg, 1/18 neg HPV HR + genotypes 16/18/45 neg  . Anemia   . 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 HPV in female   . Hypothyroidism   . 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 S/P VSD repair 1986   While living in [place]  . Thyroid disease    Past Surgical History:  Procedure Laterality Date  . COLPOSCOPY     1/18 LGSIL  . VSD REPAIR  1986   In [place]   Family History: family history includes Breast cancer in her paternal aunt; Diabetes in her maternal grandmother, mother, and sister; Hypertension in her father and mother; Ovarian cancer in her sister; Uterine cancer in her mother. Social History:  reports that she has never smoked. She has never used smokeless tobacco. She reports current alcohol use. She reports that she does not use drugs.     Maternal Diabetes: No Genetic Screening: Normal Maternal Ultrasounds/Referrals: Normal Fetal Ultrasounds or other Referrals:  None Maternal Substance Abuse:  No Significant Maternal Medications:  None Significant Maternal Lab Results:  Group B Strep negative Other Comments:  None  Review of Systems  Constitutional: Negative.   All other systems reviewed and are negative.  Maternal Medical History:  Fetal activity: Perceived fetal activity is normal.   Last perceived fetal movement was within the past hour.    Prenatal complications: Polyhydramnios.   Prenatal Complications - Diabetes: none.    Dilation: 1 Effacement (%): Thick Station: Ballotable Exam by:: Javier Docker RN Blood  pressure 131/63, pulse 63, temperature 97.8 F (36.6 C), temperature source Oral, resp. rate 20, height 5\' 3"  (1.6 m), weight 92.2 kg. Maternal Exam:  Uterine Assessment: Contraction strength is mild.  Contraction frequency is irregular.   Abdomen: Patient reports no abdominal tenderness. Fetal presentation: vertex  Introitus: Normal vulva. Normal vagina.  Ferning test: not done.  Nitrazine test: not done.  Pelvis: adequate for delivery.   Cervix: Cervix evaluated by digital exam.     Physical Exam  Nursing note and vitals reviewed. Constitutional: She is oriented to person, place, and time. She appears well-developed and well-nourished.  HENT:  Head: Normocephalic and atraumatic.  Neck: Normal range of motion. Neck supple.  Cardiovascular: Normal rate and regular rhythm.  Respiratory: Effort normal and breath sounds normal.  GI: Soft. Bowel sounds are normal.  Genitourinary:    Vulva, vagina and uterus normal.   Musculoskeletal: Normal range of motion.  Neurological: She is alert and oriented to person, place, and time. She has normal reflexes.  Skin: Skin is warm and dry.  Psychiatric: She has a normal mood and affect.    Prenatal labs: ABO, Rh: --/--/O POS, O POS Performed at [facility], [place],  [place] [address]  (11/30 0040) Antibody: NEG (11/30 0040) Rubella: Immune (05/28 0000) RPR: Nonreactive (05/28 0000)  HBsAg: Negative (05/28 0000)  HIV: Non-reactive (05/28 0000)  GBS: Negative/-- (11/20 0000)   Assessment/Plan: 39+wks AMA IOL   Ricky Gallery J 08/01/2019, 6:54 AM

## 2019-08-01 NOTE — Anesthesia Preprocedure Evaluation (Signed)
Anesthesia Evaluation  Patient identified by MRN, date of birth, ID band Patient awake    Reviewed: Allergy & Precautions, H&P , NPO status , Patient's Chart, lab work & pertinent test results  History of Anesthesia Complications Negative for: history of anesthetic complications  Airway Mallampati: II  TM Distance: >3 FB Neck ROM: full    Dental no notable dental hx.    Pulmonary neg pulmonary ROS,    Pulmonary exam normal        Cardiovascular Normal cardiovascular exam+ Valvular Problems/Murmurs (s/p childhood VSD repair; normal echo 07/19/19)  Rhythm:regular Rate:Normal     Neuro/Psych negative neurological ROS  negative psych ROS   GI/Hepatic negative GI ROS, Neg liver ROS,   Endo/Other  Hypothyroidism   Renal/GU negative Renal ROS  negative genitourinary   Musculoskeletal   Abdominal   Peds  Hematology negative hematology ROS (+)   Anesthesia Other Findings   Reproductive/Obstetrics (+) Pregnancy                             Anesthesia Physical Anesthesia Plan  ASA: III  Anesthesia Plan: Epidural   Post-op Pain Management:    Induction:   PONV Risk Score and Plan:   Airway Management Planned:   Additional Equipment:   Intra-op Plan:   Post-operative Plan:   Informed Consent: I have reviewed the patients History and Physical, chart, labs and discussed the procedure including the risks, benefits and alternatives for the proposed anesthesia with the patient or authorized representative who has indicated his/her understanding and acceptance.       Plan Discussed with:   Anesthesia Plan Comments:         Anesthesia Quick Evaluation

## 2019-08-01 NOTE — Lactation Note (Signed)
This note was copied from a baby's chart. Lactation Consultation Note  Patient Name: [name] Hurst [id number] Date: 08/01/2019 Reason for consult: Initial assessment;Term;Other (Comment);Maternal endocrine disorder(AMA) Type of Endocrine Disorder?: Thyroid(Hx of hypothyroid)  7 hours old FT female who is being partially BF and formula fed by her mother, she's a P3 and experienced BF. She BF her first baby for 3 years and her second one for 2 years. They're now 29 and 92 y.o, dad said this baby is a Flippin baby. She has a DEBP at home that she got through her insurance.  Parents have started supplementing with Similac formula already because mom voiced "she didn't have any milk".  She's already familiar with hand expression, when revising hand expression with her, she was able to get colostrum out of both breasts, it came out more easily out of the right one though, she was also able to do teach back and get it on her own, praised her for her efforts. LC rubbed the colostrum on baby's mouth but she didn't wake up.  Offered assistance with latch but mom politely declined, she said baby just fed; she was asleep. Asked mom to call for assistance when needed. Reviewed normal newborn behavior, feeding cues and cluster feeding. Mom has Hx of hypothyroid, but she's still able to express colostrum and now feels more empowered to do so after she saw it for herself. She's aware of the importance of breast stimulation in order to continue the process of lactogenesis II.  Feeding plan:  1. Encouraged mom to feed baby STS 8-12 times/24 hours or sooner if feeding cues are present 2. Hand expression and spoon/finger feeding were also encouraged 3. Parents will continue supplementing baby with Similac 20 calorie formula as their feeding choice on admission (to do both) but will her her RN know if she's ready to start pumping while at the hospital  BF brochure, BF resources and feeding diary were  reviewed. Dad present and supportive, he encouraged mom to call for help whenever she needs it since it's been 15 years she hasn't done this. Parents reported all questions and concerns were answered, they're both aware of [facility] OP services and will call PRN.  Maternal Data Formula Feeding for Exclusion: Yes Reason for exclusion: Mother's choice to formula and breast feed on admission Has patient been taught Hand Expression?: Yes Does the patient have breastfeeding experience prior to this delivery?: Yes  Feeding Feeding Type: Bottle Fed - Formula Nipple Type: Slow - flow  LATCH Score                   Interventions Interventions: Breast feeding basics reviewed;Breast massage;Hand express;Breast compression  Lactation Tools Discussed/Used WIC Program: No   Consult Status Consult Status: Follow-up Date: 08/02/19 Follow-up type: In-patient    [name] Hurst 08/01/2019, 7:55 PM

## 2019-08-02 LAB — CBC
HCT: 32.3 % — ABNORMAL LOW (ref 36.0–46.0)
Hemoglobin: 10.8 g/dL — ABNORMAL LOW (ref 12.0–15.0)
MCH: 31 pg (ref 26.0–34.0)
MCHC: 33.4 g/dL (ref 30.0–36.0)
MCV: 92.8 fL (ref 80.0–100.0)
Platelets: 202 10*3/uL (ref 150–400)
RBC: 3.48 MIL/uL — ABNORMAL LOW (ref 3.87–5.11)
RDW: 14.3 % (ref 11.5–15.5)
WBC: 9.4 10*3/uL (ref 4.0–10.5)
nRBC: 0 % (ref 0.0–0.2)

## 2019-08-02 MED ORDER — ACETAMINOPHEN 325 MG PO TABS: 650.0000 mg | ORAL_TABLET | ORAL | 2 refills | Status: DC | PRN

## 2019-08-02 MED ORDER — MAGNESIUM OXIDE 400 (241.3 MG) MG PO TABS: 400.0000 mg | ORAL_TABLET | Freq: Every day | ORAL | Status: DC

## 2019-08-02 MED ORDER — POLYSACCHARIDE IRON COMPLEX 150 MG PO CAPS: 150.0000 mg | ORAL_CAPSULE | Freq: Every day | ORAL | Status: DC

## 2019-08-02 MED ORDER — IBUPROFEN 600 MG PO TABS: 600.0000 mg | ORAL_TABLET | Freq: Four times a day (QID) | ORAL | 0 refills | Status: DC

## 2019-08-02 MED ORDER — POLYSACCHARIDE IRON COMPLEX 150 MG PO CAPS: 150.0000 mg | ORAL_CAPSULE | Freq: Every day | ORAL | 3 refills | Status: DC

## 2019-08-02 NOTE — Discharge Summary (Signed)
Obstetric Discharge Summary   Patient Name: [name] Hurst DOB: 14-May-1976 MRN: [id number]  Date of Admission: 08/01/2019 Date of Discharge: 08/02/2019 Date of Delivery: 08/01/2019 Gestational Age at Delivery: [redacted]w[redacted]d  Primary OB: [facility]  Antepartum complications:  - AMA - polyhydramnios  - Hypothyroidism on Synthroid 20mcg - HSV-1 - Hx. Of VSD repair  - Hx. Of heart murmur  Prenatal Labs:  ABO, Rh: --/--/O POS, O POS Performed at [facility] [address], [place], [place] [address]  [phone]) Antibody: NEG (11/30 0040) Rubella: Immune (05/28 0000) RPR: Nonreactive (05/28 0000)  HBsAg: Negative (05/28 0000)  HIV: Non-reactive (05/28 0000)  GBS: Negative/-- (11/20 0000)  Admitting Diagnosis: IOL at 16 weeks for AMA and polyhydramnios   Secondary Diagnoses: Patient Active Problem List   Diagnosis Date Noted  . Encounter for planned induction of labor 08/01/2019  . SVD (spontaneous vaginal delivery) 08/01/2019  . Postpartum care following vaginal delivery (11/30) 08/01/2019  . Ejection murmur 07/15/2019  . Edema 07/08/2019  . Murmur, cardiac 07/08/2019  . [redacted] weeks gestation of pregnancy 07/08/2019  . Hypothyroidism 09/06/2018  . 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 Obesity (BMI 30-39.9) 06/30/2018  . Abnormal otoscopic exam of left ear 06/30/2018  . 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 S/P VSD repair 07/14/1985   Induction: Cytotec  Augmentation: AROM , Pitocin  Complications: None  Date of Delivery: 08/01/2019 Delivered By: Dr. Ronita Hipps  Delivery Type: spontaneous vaginal delivery Anesthesia: epidural Placenta: sponatneous Laceration: none Episiotomy: none  Newborn Data: Live born female  Birth Weight: 7 lb 15.7 oz (3620 g) APGAR: 7, 9  Newborn Delivery   Birth date/time: 08/01/2019 12:31:00 Delivery type: Vaginal, Spontaneous         Hospital/Postpartum Course  (Vaginal Delivery): Pt.  Admitted for IOL at 39+ weeks for AMA and polyhydramnios.  She was induced with Cytotec, then augmented with Pitocin and AROM.  She delivered by NSVD. Please see delivery summary and notes for details. Patient had an uncomplicated postpartum course.  By time of discharge on PPD#1, her pain was controlled on oral pain medications; she had appropriate lochia and was ambulating, voiding without difficulty and tolerating regular diet.  She was deemed stable for discharge to home.     Labs: CBC Latest Ref Rng & Units 08/02/2019 08/01/2019 07/08/2019  WBC 4.0 - 10.5 K/uL 9.4 6.8 5.5  Hemoglobin 12.0 - 15.0 g/dL 10.8(L) 11.3(L) 10.3 Repeated and verified X2.(L)  Hematocrit 36.0 - 46.0 % 32.3(L) 35.0(L) 30.8(L)  Platelets 150 - 400 K/uL 202 [id number]   Conflict (See Lab Report): O POS/O POS Performed at [facility], [place] [address], [place], [place] [address]   Physical exam:  BP (!) 98/41   Pulse (!) 56   Temp 98.3 F (36.8 C) (Oral)   Resp 18   Ht 5\' 3"  (1.6 m)   Wt 92.2 kg   LMP  (LMP Unknown) Comment: it was in Nov  SpO2 99%   Breastfeeding Unknown   BMI 36.00 kg/m   Alert and oriented X3             Lungs: Clear and unlabored             Heart: regular rate and rhythm / (+) murmur             Abdomen: soft, non-tender, non-distended              Fundus:  firm, non-tender, U-E slightly displaced to the right (bladder full)             Perineum: intact, no edema              Lochia: small rubra on plad              Extremities: +1 LE edema, no calf pain or tenderness               Disposition: stable, discharge to home Baby Feeding: breast milk and formula Baby Disposition: home with mom   Rh Immune globulin given: N/A Rubella vaccine given: N/A Tdap vaccine given in AP or PP setting:  Flu vaccine given in AP or PP setting:    Plan:  [name] Hurst was discharged to home in good condition. Follow-up appointment at [facility] in 6 weeks.  Discharge  Instructions: Per After Visit Summary. Refer to After Visit Summary and [facility] discharge booklet  Activity: Advance as tolerated. Pelvic rest for 6 weeks.  Discharge Instructions    Activity as tolerated   Complete by: As directed    Ambulatory referral to Lactation   Complete by: As directed    Reason for consult: The Mother-Infant Dyad Needs Assistance in the Continuation of Breastfeeding   Call MD for:   Complete by: As directed    Call for signs/symptoms of postpartum depression/anxiety, if you have thoughts of hurting yourself, your baby or others   Call MD for:  difficulty breathing, headache or visual disturbances   Complete by: As directed    Call MD for:  extreme fatigue   Complete by: As directed    Call MD for:  hives   Complete by: As directed    Call MD for:  persistant dizziness or light-headedness   Complete by: As directed    Call MD for:  persistant nausea and vomiting   Complete by: As directed    Call MD for:  redness, tenderness, or signs of infection (pain, swelling, redness, odor or green/yellow discharge around incision site)   Complete by: As directed    Call MD for:  severe uncontrolled pain   Complete by: As directed    Call MD for:  temperature >100.4   Complete by: As directed    Diet - low sodium heart healthy   Complete by: As directed    Sexual activity   Complete by: As directed    Pelvic rest/No sexual activity for 6 weeks      Diet: Regular, Heart Healthy Discharge Medications: Allergies as of 08/02/2019   No Known Allergies     Medication List    STOP taking these medications   ferrous sulfate 325 (65 FE) MG tablet   multivitamin-prenatal 27-0.8 MG Tabs tablet     TAKE these medications   acetaminophen 325 MG tablet Commonly known as: Tylenol Take 2 tablets (650 mg total) by mouth every 4 (four) hours as needed (for pain scale < 4). What changed:   medication strength  how much to take  when to take this  reasons  to take this   ibuprofen 600 MG tablet Commonly known as: ADVIL Take 1 tablet (600 mg total) by mouth every 6 (six) hours.   iron polysaccharides 150 MG capsule Commonly known as: NIFEREX Take 1 capsule (150 mg total) by mouth daily.   levothyroxine 25 MCG tablet Commonly known as: SYNTHROID Take 1 tablet (25 mcg total) by mouth daily.   Vitafol Ultra 29-0.6-0.4-200 MG  Caps Take 1 capsule by mouth daily.      Outpatient follow up:  Follow-up Information    Brien Few, MD. Schedule an appointment as soon as possible for a visit in 6 week(s).   Specialty: Obstetrics and Gynecology Why: Postpartum visit  Contact information: [place] [address] [phone]           Signed:  Lars Pinks, MSN, CNM [facility]

## 2019-08-02 NOTE — Plan of Care (Signed)
  Problem: Education: Goal: Knowledge of condition will improve Outcome: Completed/Met   Problem: Activity: Goal: Will verbalize the importance of balancing activity with adequate rest periods Outcome: Completed/Met Goal: Ability to tolerate increased activity will improve Outcome: Completed/Met   Problem: Life Cycle: Goal: Chance of risk for complications during the postpartum period will decrease Outcome: Completed/Met   Problem: Role Relationship: Goal: Ability to demonstrate positive interaction with newborn will improve Outcome: Completed/Met   Problem: Education: Goal: Knowledge of General Education information will improve Description: Including pain rating scale, medication(s)/side effects and non-pharmacologic comfort measures Outcome: Completed/Met   Problem: Clinical Measurements: Goal: Ability to maintain clinical measurements within normal limits will improve Outcome: Completed/Met Goal: Will remain free from infection Outcome: Completed/Met Goal: Diagnostic test results will improve Outcome: Completed/Met Goal: Respiratory complications will improve Outcome: Completed/Met Goal: Cardiovascular complication will be avoided Outcome: Completed/Met   Problem: Activity: Goal: Risk for activity intolerance will decrease Outcome: Completed/Met   Problem: Elimination: Goal: Will not experience complications related to urinary retention Outcome: Completed/Met   Problem: Pain Managment: Goal: General experience of comfort will improve Outcome: Completed/Met   Problem: Safety: Goal: Ability to remain free from injury will improve Outcome: Completed/Met

## 2019-08-02 NOTE — Progress Notes (Addendum)
PPD #1, SVD, intact, baby girl "Truely''  S:  Reports feeling tired, but desires early d/c home at 24 hours             Tolerating po/ No nausea or vomiting / Denies dizziness or SOB             Bleeding is light             Pain controlled with Motrin             Up ad lib / ambulatory / voiding QS  Newborn breast feeding with formula supplementation; baby has been cluster feeding   O:               VS: BP (!) 98/41   Pulse (!) 56   Temp 98.3 F (36.8 C) (Oral)   Resp 18   Ht 5\' 3"  (1.6 m)   Wt 92.2 kg   LMP  (LMP Unknown) Comment: it was in Nov  SpO2 99%   Breastfeeding Unknown   BMI 36.00 kg/m    LABS:              Recent Labs    08/01/19 0055 08/02/19 0458  WBC 6.8 9.4  HGB 11.3* 10.8*  PLT 225 202               Blood type: --/--/O POS, O POS Performed at [facility], [address], [place], [place] [address]  [phone])  Rubella: Immune (05/28 0000)                     I&O: Intake/Output      11/30 0701 - 12/01 0700 12/01 0701 - 12/02 0700   I.V. (mL/kg) 2275.6 (24.7)    Other 0    IV Piggyback 100    Total Intake(mL/kg) 2375.6 (25.8)    Urine (mL/kg/hr) 550 (0.2)    Blood 220    Total Output 770    Net +1605.6                       Physical Exam:             Alert and oriented X3  Lungs: Clear and unlabored  Heart: regular rate and rhythm / (+) murmur  Abdomen: soft, non-tender, non-distended              Fundus: firm, non-tender, U-E slightly displaced to the right (bladder full)  Perineum: intact, no edema   Lochia: small rubra on plad   Extremities: +1 LE edema, no calf pain or tenderness    A: PPD # 1, SVD  ABL Anemia compounding chronic IDA   - Niferex 150mg  PO daily   - Magnesium oxide 400mg  PO daily    Hx. Of Hypothyroidism   - on Synthroid 79mcg   - Plan for f/u PP  Doing well - stable status  Routine post partum orders  Encouraged to void every 2-3 hours to reduce bleeding and prevent overdistention   Discharge home  today  WOB discharge book given, instructions and warning s/s reviewed  F/u with Dr. Ronita Hipps in 6 weeks   Lars Pinks, MSN, [facility]

## 2019-08-02 NOTE — Plan of Care (Signed)
  Problem: Education: Goal: Knowledge of condition will improve Outcome: Completed/Met   Problem: Activity: Goal: Will verbalize the importance of balancing activity with adequate rest periods Outcome: Completed/Met   Problem: Activity: Goal: Ability to tolerate increased activity will improve Outcome: Completed/Met   Problem: Life Cycle: Goal: Chance of risk for complications during the postpartum period will decrease Outcome: Completed/Met   Problem: Role Relationship: Goal: Ability to demonstrate positive interaction with newborn will improve Outcome: Completed/Met   Problem: Education: Goal: Knowledge of General Education information will improve Description: Including pain rating scale, medication(s)/side effects and non-pharmacologic comfort measures Outcome: Completed/Met   Problem: Clinical Measurements: Goal: Ability to maintain clinical measurements within normal limits will improve Outcome: Completed/Met   Problem: Education: Goal: Knowledge of General Education information will improve Description: Including pain rating scale, medication(s)/side effects and non-pharmacologic comfort measures Outcome: Completed/Met   Problem: Clinical Measurements: Goal: Ability to maintain clinical measurements within normal limits will improve Outcome: Completed/Met   Problem: Clinical Measurements: Goal: Will remain free from infection Outcome: Completed/Met   Problem: Clinical Measurements: Goal: Diagnostic test results will improve Outcome: Completed/Met   Problem: Clinical Measurements: Goal: Respiratory complications will improve Outcome: Completed/Met   Problem: Safety: Goal: Ability to remain free from injury will improve Outcome: Completed/Met   Problem: Pain Managment: Goal: General experience of comfort will improve Outcome: Completed/Met   Problem: Elimination: Goal: Will not experience complications related to urinary retention Outcome:  Completed/Met   Problem: Activity: Goal: Risk for activity intolerance will decrease Outcome: Completed/Met   Problem: Elimination: Goal: Will not experience complications related to bowel motility Outcome: Completed/Met

## 2019-08-02 NOTE — Plan of Care (Signed)
  Problem: Education: Goal: Knowledge of condition will improve Outcome: Completed/Met   Problem: Activity: Goal: Will verbalize the importance of balancing activity with adequate rest periods Outcome: Completed/Met Goal: Ability to tolerate increased activity will improve Outcome: Completed/Met   Problem: Life Cycle: Goal: Chance of risk for complications during the postpartum period will decrease Outcome: Completed/Met   Problem: Role Relationship: Goal: Ability to demonstrate positive interaction with newborn will improve Outcome: Completed/Met   Problem: Education: Goal: Knowledge of General Education information will improve Description: Including pain rating scale, medication(s)/side effects and non-pharmacologic comfort measures Outcome: Completed/Met   Problem: Clinical Measurements: Goal: Ability to maintain clinical measurements within normal limits will improve Outcome: Completed/Met Goal: Will remain free from infection Outcome: Completed/Met Goal: Diagnostic test results will improve Outcome: Completed/Met Goal: Respiratory complications will improve Outcome: Completed/Met Goal: Cardiovascular complication will be avoided Outcome: Completed/Met   Problem: Activity: Goal: Risk for activity intolerance will decrease Outcome: Completed/Met   Problem: Elimination: Goal: Will not experience complications related to bowel motility Outcome: Completed/Met Goal: Will not experience complications related to urinary retention Outcome: Completed/Met   Problem: Pain Managment: Goal: General experience of comfort will improve Outcome: Completed/Met   Problem: Safety: Goal: Ability to remain free from injury will improve Outcome: Completed/Met

## 2019-08-02 NOTE — Lactation Note (Signed)
This note was copied from a baby's chart. Lactation Consultation Note  Patient Name: [name] Hurst [id number] Date: 08/02/2019 Reason for consult: Follow-up assessment Baby is 19 hours old/5% weight loss.  Mom reports baby is latching well but she does not have milk.  She is aware she has colostrum and milk will come to volume in 3-5 days.  Discussed prevention and treatment of engorgement.  Baby is also receiving formula supplementation per parents choice.  Mom states she plans on pumping when she goes home.  I offered feeding assist and mom states she will call when baby is ready to feed.  Maternal Data    Feeding Feeding Type: Bottle Fed - Formula  LATCH Score                   Interventions    Lactation Tools Discussed/Used     Consult Status Consult Status: Follow-up Date: 08/02/19 Follow-up type: In-patient    Ave Filter 08/02/2019, 8:24 AM

## 2019-09-16 ENCOUNTER — Other Ambulatory Visit: Payer: Self-pay | Admitting: Family Medicine

## 2019-11-15 ENCOUNTER — Ambulatory Visit (HOSPITAL_BASED_OUTPATIENT_CLINIC_OR_DEPARTMENT_OTHER)
Admission: RE | Admit: 2019-11-15 | Discharge: 2019-11-15 | Disposition: A | Discharge status: Home / Self Care | Payer: 59 | Source: Ambulatory Visit | Attending: Family Medicine | Admitting: Family Medicine

## 2019-11-15 ENCOUNTER — Ambulatory Visit: Payer: 59 | Admitting: Family Medicine

## 2019-11-15 ENCOUNTER — Telehealth: Payer: Self-pay | Admitting: Family Medicine

## 2019-11-15 ENCOUNTER — Encounter: Payer: Self-pay | Admitting: Family Medicine

## 2019-11-15 ENCOUNTER — Other Ambulatory Visit: Payer: Self-pay

## 2019-11-15 VITALS — BP 102/66 | HR 55 | Temp 97.6°F | Resp 16 | Ht 62.0 in | Wt 188.5 lb

## 2019-11-15 DIAGNOSIS — M25532 Pain in left wrist: Secondary | ICD-10-CM | POA: Diagnosis present

## 2019-11-15 NOTE — Patient Instructions (Signed)
Please have xray at [facility] [place].  Once I get the results we will call you with plan.

## 2019-11-15 NOTE — Progress Notes (Signed)
This visit occurred during the SARS-CoV-2 public health emergency.  Safety protocols were in place, including screening questions prior to the visit, additional usage of staff PPE, and extensive cleaning of exam room while observing appropriate contact time as indicated for disinfecting solutions.    [name] Hurst , 1976/08/30, 44 y.o., female MRN: [id number] Patient Care Team    Relationship Specialty Notifications Start End  Ma Hillock, DO PCP - General Family Medicine  06/30/18   Kem Boroughs, [place]  Gynecology  06/30/18   Helayne Seminole, MD Consulting Physician Otolaryngology  09/15/18     Chief Complaint  Patient presents with  . Pain    Left wrist pain x9 months. Denies injury. Pain is all the time and worse using it. She has used a brace with no relief.      Subjective: Pt presents for an OV with complaints of left wrist pain of 9 months  duration.  Associated symptoms include pain at all times located near distal radius. Pain is worsened by movement and weight.She does not recall injury prior to onset or in the past. She started wearing a wrist splint- which she states was not helpful. She recently gave birth in November pain started during pregnancy.   Depression screen [facility] 2/9 07/08/2019 06/30/2018  Decreased Interest 0 0  Down, Depressed, Hopeless 0 0  PHQ - 2 Score 0 0    No Known Allergies Social History   Social History Narrative   Marital status/children/pets: Married, 2 children. She is from [place] originally and her husband from [place].    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 Abnormal Pap smear of cervix    12/16 neg pap HPV HR+ 16/18 neg, 1/18 neg HPV HR + genotypes 16/18/45 neg  .  Anemia   . 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 HPV in female   . Hypothyroidism   . 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 S/P VSD repair 1986   While living in [place]  . Thyroid disease    Past Surgical History:  Procedure Laterality Date  . COLPOSCOPY     1/18 LGSIL  . VSD REPAIR  1986   In [place]   Family History  Problem Relation Age of Onset  . Hypertension Mother   . Diabetes Mother   . Uterine cancer Mother   . Hypertension Father   . Diabetes Sister   . Breast cancer Paternal Aunt   . Diabetes Maternal Grandmother   . Ovarian cancer Sister    Allergies as of 11/15/2019   No Known Allergies     Medication List       Accurate as of November 15, 2019  9:30 AM. If you have any questions, ask your nurse or doctor.        STOP taking these medications   acetaminophen 325 MG tablet Commonly known as: Tylenol Stopped by: Howard Pouch, DO   ibuprofen 600 MG tablet Commonly known as: ADVIL Stopped by: Howard Pouch, DO     TAKE these medications  iron polysaccharides 150 MG capsule Commonly known as: NIFEREX Take 1 capsule (150 mg total) by mouth daily.   levothyroxine 25 MCG tablet Commonly known as: SYNTHROID Take 1 tablet (25 mcg total) by mouth daily.   Norlyda 0.35 MG tablet Generic drug: norethindrone Take 1 tablet by mouth daily.   Vitafol Ultra 29-0.6-0.4-200 MG Caps Take 1 capsule by mouth daily.       All past medical history, surgical history, allergies, family history, immunizations andmedications were updated in the EMR today and reviewed under the history and medication portions of their EMR.     ROS: Negative, with the exception of above mentioned in HPI   Objective:  BP 102/66 (BP Location: Left Arm, Patient Position: Sitting, Cuff Size: Normal)   Pulse (!) 55   Temp 97.6 F (36.4 C) (Temporal)   Resp 16   Ht 5\' 2"  (1.575 m)   Wt 188 lb 8 oz  (85.5 kg)   SpO2 98%   Breastfeeding Yes   BMI 34.48 kg/m  Body mass index is 34.48 kg/m. Gen: Afebrile. No acute distress. Nontoxic in appearance, well developed, well nourished.  MSK: no erythema, no soft tissue welling. Bony mass, tender, over distal radius/head. Pain with pronation and supination, flexion. Neg tinels elbow and wrist. NV intact distally.  Skin: no rashes, purpura or petechiae.  Psych: Normal affect, dress and demeanor. Normal speech. Normal thought content and judgment.  No exam data present No results found. No results found for this or any previous visit (from the past 24 hour(s)).  Assessment/Plan: [name] Hurst is a 44 y.o. female present for OV for  Left wrist pain - Pt does not recall a injury that occurred prior to onset of pain > 9 months ago. She does have a bony mass over the location of pain that may be from a prior unknown fracture vs bone lesion. Will obtain xray today and discuss further pain after results.  - she has a wrist splint that she wore, but it has not been helpful.  - She is breast feeding- caution if meds used.  - DG Wrist Complete Left; Future   Reviewed expectations re: course of current medical issues.  Discussed self-management of symptoms.  Outlined signs and symptoms indicating need for more acute intervention.  Patient verbalized understanding and all questions were answered.  Patient received an After-Visit Summary.    Orders Placed This Encounter  Procedures  . DG Wrist Complete Left   No orders of the defined types were placed in this encounter.  Referral Orders  No referral(s) requested today     Note is dictated utilizing voice recognition software. Although note has been proof read prior to signing, occasional typographical errors still can be missed. If any questions arise, please do not hesitate to call for verification.   electronically signed by:  Howard Pouch, DO  [place]

## 2019-11-15 NOTE — Telephone Encounter (Signed)
Please inform patient there does not appear to be a bony mass or fracture at the location of her pain.  For I would suggest referral to a sports medicine doctor over an orthopedic doctor-given there is no obvious bone cause for her pain.  I have placed a referral for her and she should be hearing from them shortly in order to schedule.  In the meantime I would encourage her to go back to wearing her wrist splint for now.  Unfortunately, since she is breast-feeding would not encourage routine use of an anti-inflammatory at this time (Advil-Aleve).

## 2019-11-15 NOTE — Telephone Encounter (Signed)
Pt was called and given results/information, she verbalized understanding

## 2019-11-23 ENCOUNTER — Encounter: Payer: Self-pay | Admitting: Certified Nurse Midwife

## 2019-12-05 ENCOUNTER — Encounter: Payer: Self-pay | Admitting: Family Medicine

## 2019-12-05 ENCOUNTER — Ambulatory Visit: Payer: 59 | Admitting: Family Medicine

## 2019-12-05 ENCOUNTER — Other Ambulatory Visit: Payer: Self-pay

## 2019-12-05 ENCOUNTER — Ambulatory Visit: Payer: Self-pay

## 2019-12-05 VITALS — BP 102/70 | HR 62 | Ht 62.0 in | Wt 190.0 lb

## 2019-12-05 DIAGNOSIS — M654 Radial styloid tenosynovitis [de Quervain]: Secondary | ICD-10-CM | POA: Diagnosis not present

## 2019-12-05 DIAGNOSIS — M25532 Pain in left wrist: Secondary | ICD-10-CM | POA: Diagnosis not present

## 2019-12-05 NOTE — Patient Instructions (Addendum)
Thank you for coming in today. 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  Use the wrist brace as needed.  Use over the counter Voltaren gel up to 4x daily.  Do the home exercises.  Recheck with me in 4 weeks.

## 2019-12-05 NOTE — Progress Notes (Signed)
   Subjective:   I, Judy Pimple, am serving as a scribe for Dr. Lynne Leader.  I'm seeing this patient as a consultation for: Kuneff, Renee A, DO. Note will be routed back to referring provider/PCP.  CC: L wrist pain   HPI: Patient is a 44 year old female who presents with L wrist pain which has been going on for 9 months patient is not aware of an incident prior to onset of pain. Patient rates pain 9/10 and describes pain as aching in nature. Patient locates pain to medial wrist Patient did have an x-ray 11/15/2019 that showed no evidence of fracture or dislocation. There is no evidence of arthropathy or other focal bone abnormality. Soft tissues are unremarkable.   Radiating pain: only when bending wrist with radiate down thumb  Swelling: no Aggravating factors: hurts worse at night  Treatments tried: has a wrist splint that does not help, patient breast feeding so not taking anything but tylenol.   Past medical history, Surgical history, Family history, Social history, Allergies, and medications have been entered into the medical record, reviewed.   Review of Systems: No new headache, visual changes, nausea, vomiting, diarrhea, constipation, dizziness, abdominal pain, skin rash, fevers, chills, night sweats, weight loss, swollen lymph nodes, body aches, joint swelling, muscle aches, chest pain, shortness of breath, mood changes, visual or auditory hallucinations.   Objective:    Vitals:   12/05/19 1113  BP: 102/70  Pulse: 62  SpO2: 96%   General: Well Developed, well nourished, and in no acute distress.  Neuro/Psych: Alert and oriented x3, extra-ocular muscles intact, able to move all 4 extremities, sensation grossly intact. Skin: Warm and dry, no rashes noted.  Respiratory: Not using accessory muscles, speaking in full sentences, trachea midline.  Cardiovascular: Pulses palpable, no extremity edema. Abdomen: Does not appear distended. MSK: Left wrist normal-appearing Tender  palpation overlying radial styloid nontender otherwise. Normal wrist motion however pain with ulnar deviation. Intact strength. Positive Finkelstein's test.  Lab and Radiology Results DG Wrist Complete Left  Result Date: 11/15/2019 CLINICAL DATA:  Left wrist pain for 8 months without known injury. EXAM: LEFT WRIST - COMPLETE 3+ VIEW COMPARISON:  None. FINDINGS: There is no evidence of fracture or dislocation. There is no evidence of arthropathy or other focal bone abnormality. Soft tissues are unremarkable. IMPRESSION: Negative. Electronically Signed   By: Marijo Conception M.D.   On: 11/15/2019 15:10   I, Lynne Leader, personally (independently) visualized and performed the interpretation of the images attached in this note.   Impression and Recommendations:    Assessment and Plan: 44 y.o. female with left wrist pain consistent with de Quervain's tenosynovitis.  Patient has not had much treatment yet.  Plan for 1 month trial of conservative management with Voltaren gel thumb spica wrist splint and home exercise program.  Recheck back in 1 month if not better would likely proceed with injection.  Would like to avoid injection if possible.   No orders of the defined types were placed in this encounter.  No orders of the defined types were placed in this encounter.   Discussed warning signs or symptoms. Please see discharge instructions. Patient expresses understanding.   The above documentation has been reviewed and is accurate and complete Lynne Leader

## 2019-12-28 IMAGING — MG DIGITAL SCREENING BILATERAL MAMMOGRAM WITH TOMO AND CAD
6 of 10 series · 6 of 30 positions shown · non-contrast
Comparison: Previous exam(s).

CLINICAL DATA: Screening.

EXAM:
DIGITAL SCREENING BILATERAL MAMMOGRAM WITH TOMO AND CAD

[R CC synth-2D]
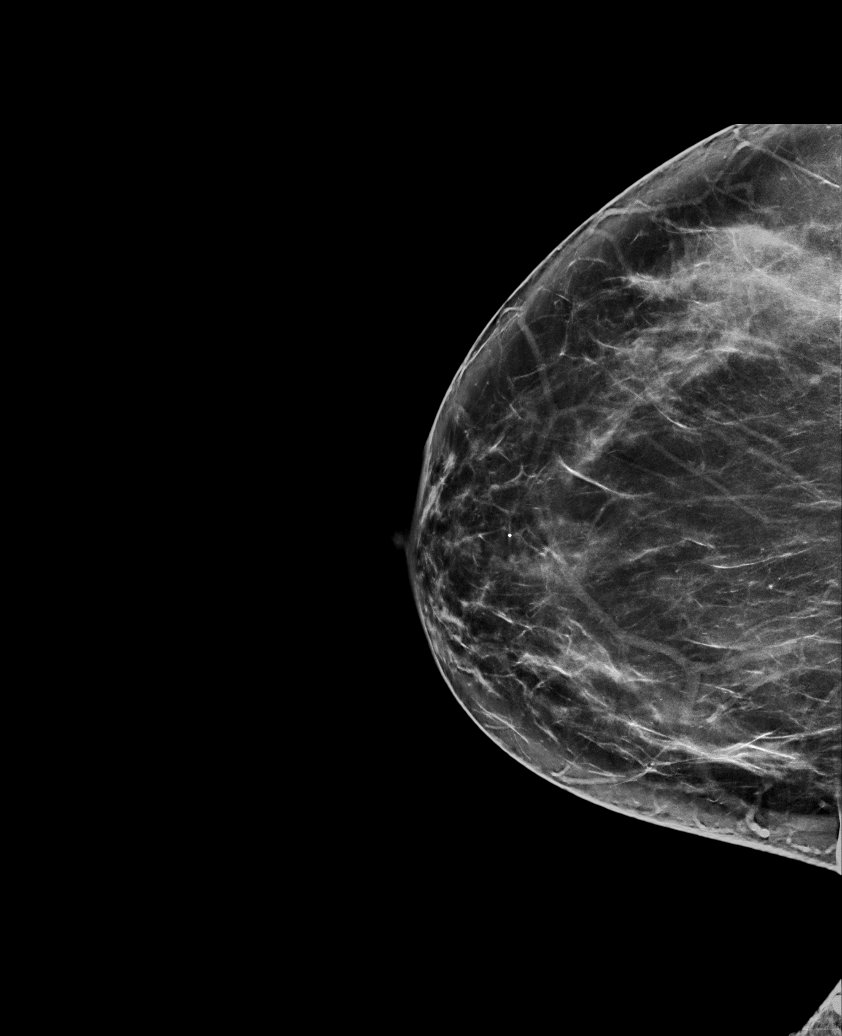

[R XCCL synth-2D]
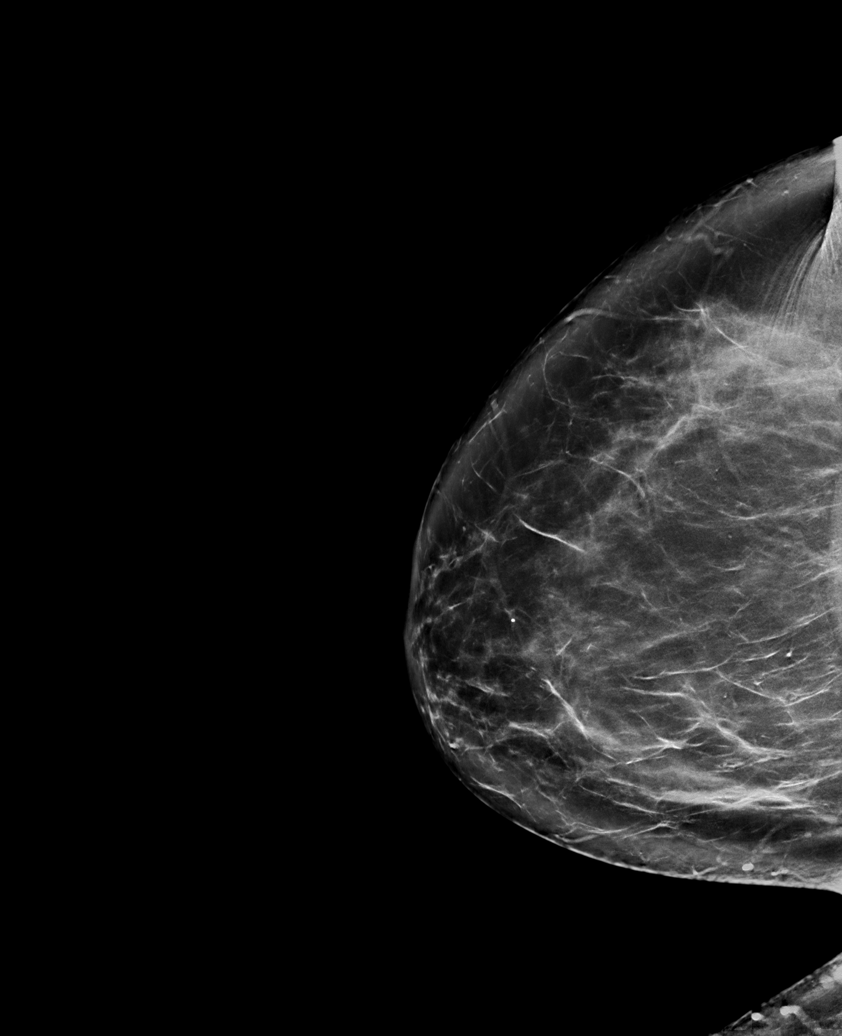

[R MLO synth-2D]
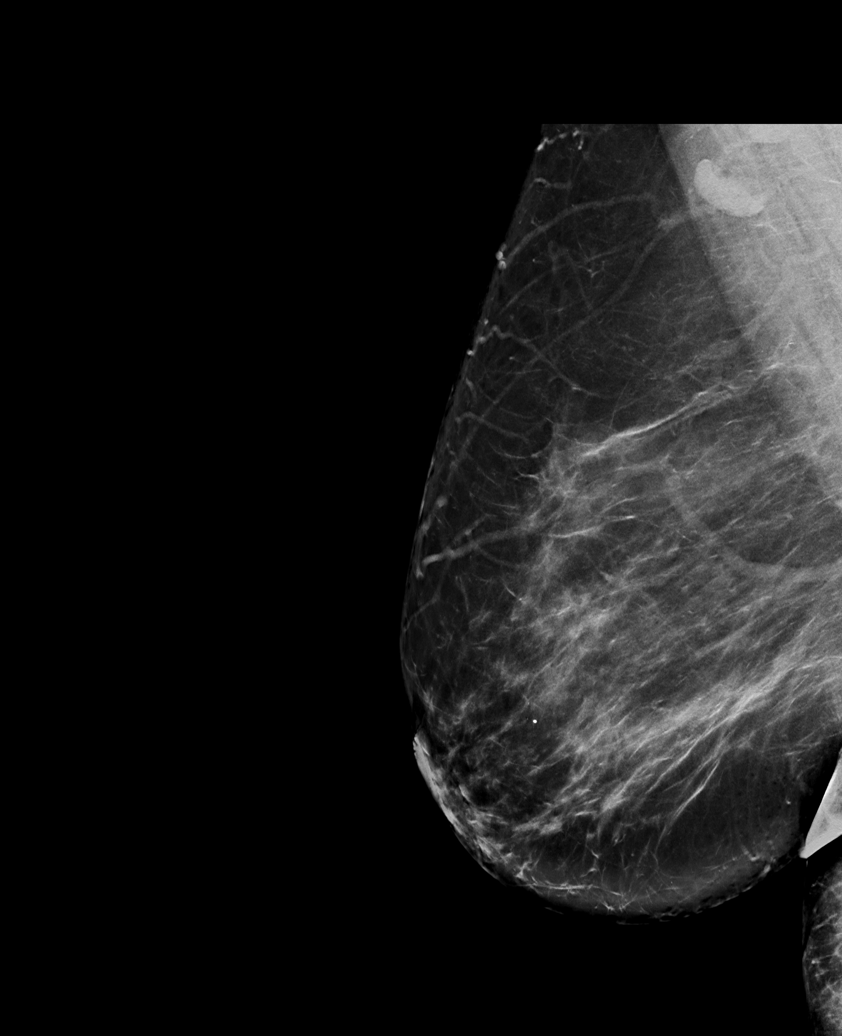

[L CC synth-2D]
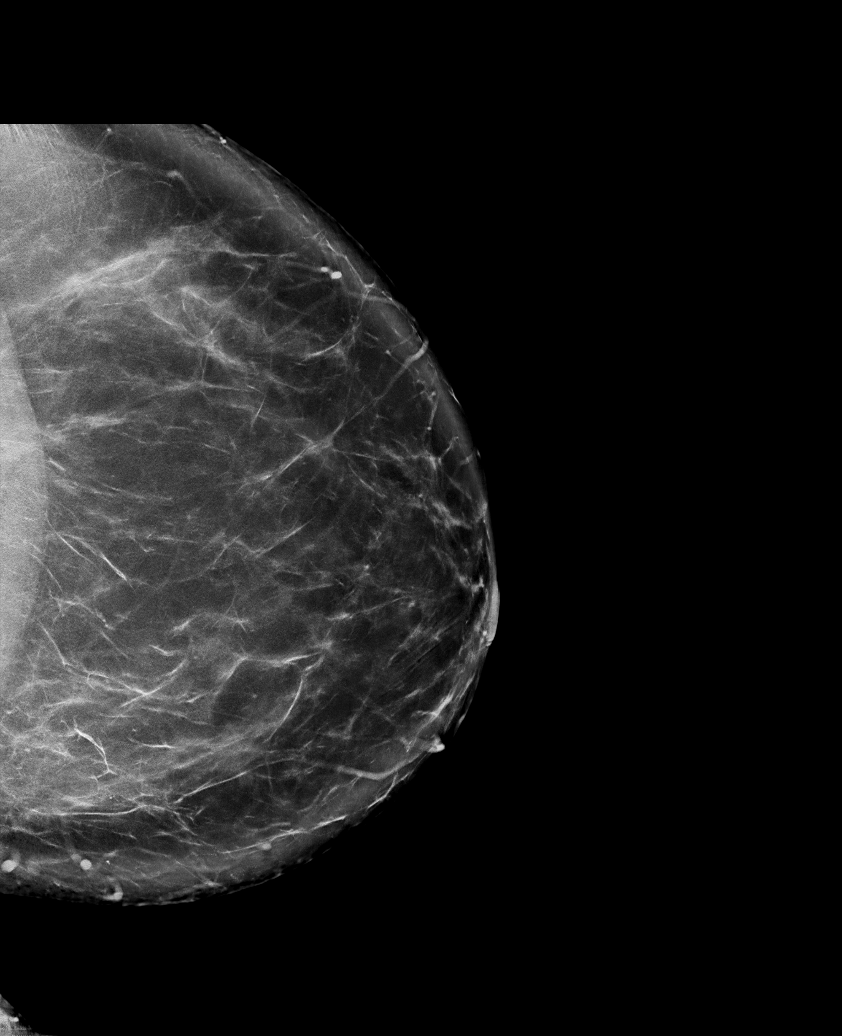

[L MLO synth-2D]
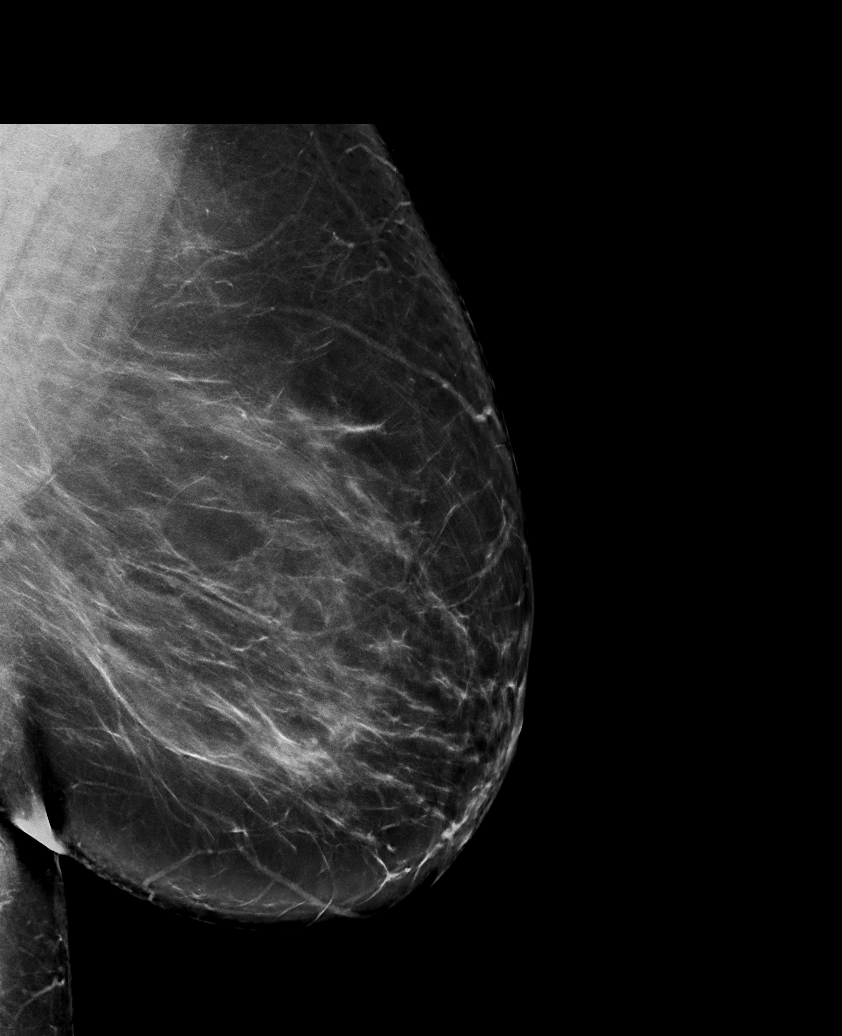

[R XCCL tomo · tomo slice 48/95.0]
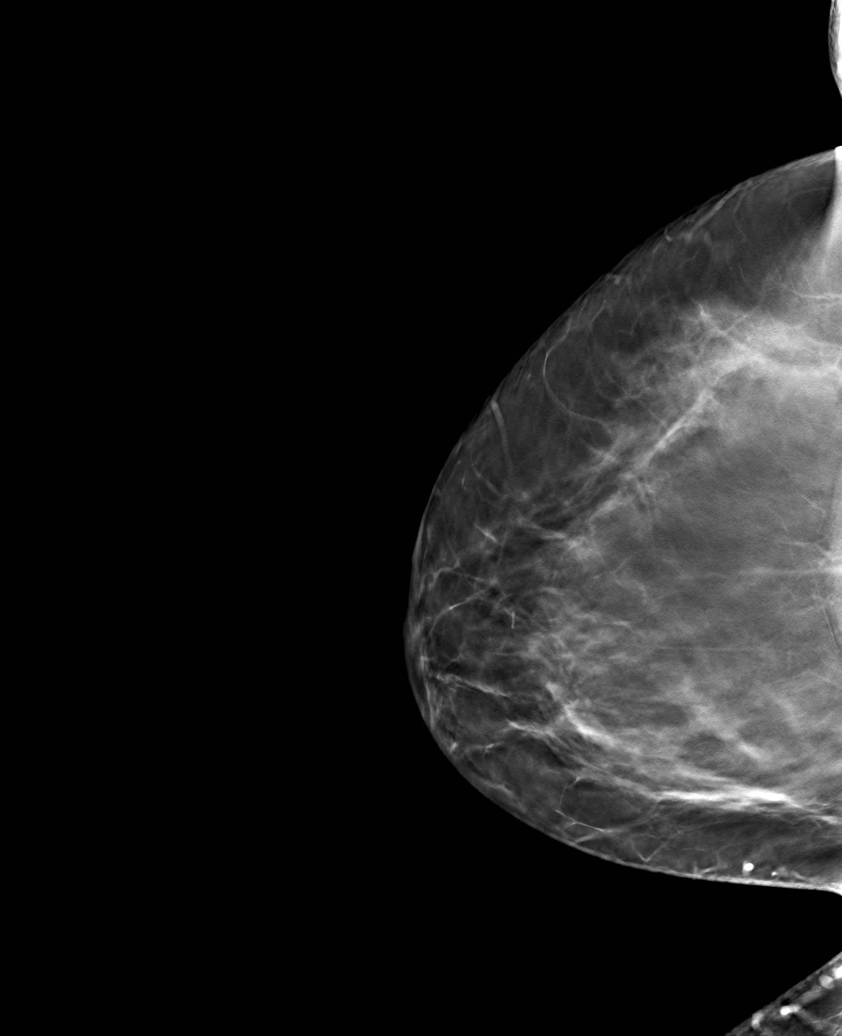

[6 of 30 positions shown; findings below may reference images not displayed]

ACR Breast Density Category c: The breast tissue is heterogeneously
dense, which may obscure small masses.
FINDINGS: There are no findings suspicious for malignancy. Images were
processed with CAD.
IMPRESSION: No mammographic evidence of malignancy. A result letter of this
screening mammogram will be mailed directly to the patient.

RECOMMENDATION:
Screening mammogram in one year. (Code:FT-[id number])

BI-RADS CATEGORY  1: Negative.

## 2020-01-02 ENCOUNTER — Ambulatory Visit: Payer: 59 | Admitting: Family Medicine

## 2020-01-02 DIAGNOSIS — Z0289 Encounter for other administrative examinations: Secondary | ICD-10-CM

## 2020-01-02 NOTE — Progress Notes (Deleted)
   I, Wendy Poet, LAT, ATC, am serving as scribe for Dr. Lynne Leader.  [name] Hurst is a 44 y.o. female who presents to [place] at [facility] today for f/u of L wrist and thumb pain.  She was last seen by Dr. Georgina Snell on 12/05/19 and was provided a wrist brace and a HEP.  Since her last visit, pt reports   Diagnostic imaging: L wrist XR- 11/15/19  Pertinent review of systems: ***  Relevant historical information: ***   Exam:  There were no vitals taken for this visit. General: Well Developed, well nourished, and in no acute distress.   MSK: ***    Lab and Radiology Results No results found for this or any previous visit (from the past 72 hour(s)). No results found.     Assessment and Plan: 44 y.o. female with ***   PDMP not reviewed this encounter. No orders of the defined types were placed in this encounter.  No orders of the defined types were placed in this encounter.    Discussed warning signs or symptoms. Please see discharge instructions. Patient expresses understanding.   ***

## 2020-03-08 ENCOUNTER — Telehealth (INDEPENDENT_AMBULATORY_CARE_PROVIDER_SITE_OTHER): Payer: 59 | Admitting: Family Medicine

## 2020-03-08 ENCOUNTER — Encounter: Payer: Self-pay | Admitting: Family Medicine

## 2020-03-08 DIAGNOSIS — R05 Cough: Secondary | ICD-10-CM

## 2020-03-08 DIAGNOSIS — R0981 Nasal congestion: Secondary | ICD-10-CM | POA: Diagnosis not present

## 2020-03-08 DIAGNOSIS — R059 Cough, unspecified: Secondary | ICD-10-CM

## 2020-03-08 NOTE — Progress Notes (Signed)
Virtual Visit via Video Note  I connected with [name] Hurst  on 03/08/20 at 11:40 AM EDT by a video enabled telemedicine application and verified that I am speaking with the correct person using two identifiers.  Location patient: home, [place] Location provider:work or home office Persons participating in the virtual visit: patient, provider, daughter  I discussed the limitations of evaluation and management by telemedicine and the availability of in person appointments. The patient expressed understanding and agreed to proceed.   HPI:  Acute visit for a cough: -started getting sick 5 days ago -symptoms scratchy throat, nasal congestion, cough, ears felt full, drainage in the throat -denies fever, SOB, NVD, loss of taste, body aches -no known sick contacts -no exposures to groups -fully vaccinated with the [place] -has a baby and is breastfeeding  ROS: See pertinent positives and negatives per HPI.  Past Medical History:  Diagnosis Date  . Abnormal Pap smear of cervix    12/16 neg pap HPV HR+ 16/18 neg, 1/18 neg HPV HR + genotypes 16/18/45 neg  . Anemia   . 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 HPV in female   . Hypothyroidism   . 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 S/P VSD repair 1986   While living in [place]  . Thyroid disease     Past Surgical History:  Procedure Laterality Date  . COLPOSCOPY     1/18 LGSIL  . VSD REPAIR  1986   In [place]    Family History  Problem Relation Age of Onset  . Hypertension Mother   . Diabetes Mother   . Uterine cancer Mother   . Hypertension Father   . Diabetes Sister   . Breast cancer Paternal Aunt   . Diabetes Maternal Grandmother   . Ovarian cancer Sister     SOCIAL HX: see hpi  Current Outpatient Medications:  .  NORLYDA 0.35 MG tablet, Take 1 tablet by mouth daily., Disp: , Rfl:  .  Prenat-Fe Poly-Methfol-FA-DHA (VITAFOL ULTRA)  29-0.6-0.4-200 MG CAPS, Take 1 capsule by mouth daily., Disp: , Rfl:   EXAM:  VITALS per patient if applicable:  GENERAL: alert, oriented, appears well and in no acute distress  HEENT: atraumatic, conjunttiva clear, no obvious abnormalities on inspection of external nose and ears  NECK: normal movements of the head and neck  LUNGS: on inspection no signs of respiratory distress, breathing rate appears normal, no obvious gross SOB, gasping or wheezing  CV: no obvious cyanosis  MS: moves all visible extremities without noticeable abnormality  PSYCH/NEURO: pleasant and cooperative, no obvious depression or anxiety, speech and thought processing grossly intact  ASSESSMENT AND PLAN:  Discussed the following assessment and plan:  Nasal congestion  Cough  -we discussed possible serious and likely etiologies, options for evaluation and workup, limitations of telemedicine visit vs in person visit, treatment, treatment risks and precautions. Pt prefers to treat via telemedicine empirically rather then risking or undertaking an in person visit at this moment. Suspect VURI most likely. Discussed symptomatic care with nasal saline, cough lozenges, fluids, tylenol an dother otc options. Discussed possibility of covid low given fully vaccinated, staying home, testing options if she wishes to do that. Patient agrees to seek prompt in person care if worsening, new symptoms arise, or if is not improving with treatment.   I discussed the assessment and treatment plan with the patient. The patient was  provided an opportunity to ask questions and all were answered. The patient agreed with the plan and demonstrated an understanding of the instructions.   The patient was advised to call back or seek an in-person evaluation if the symptoms worsen or if the condition fails to improve as anticipated.   [name], DO

## 2020-07-10 ENCOUNTER — Other Ambulatory Visit: Payer: Self-pay

## 2020-07-10 ENCOUNTER — Encounter: Payer: Self-pay | Admitting: Family Medicine

## 2020-07-10 ENCOUNTER — Ambulatory Visit (INDEPENDENT_AMBULATORY_CARE_PROVIDER_SITE_OTHER): Payer: 59 | Admitting: Family Medicine

## 2020-07-10 VITALS — BP 112/72 | HR 68 | Temp 98.3°F | Ht 64.0 in | Wt 194.0 lb

## 2020-07-10 DIAGNOSIS — Z1159 Encounter for screening for other viral diseases: Secondary | ICD-10-CM

## 2020-07-10 DIAGNOSIS — Z3041 Encounter for surveillance of contraceptive pills: Secondary | ICD-10-CM

## 2020-07-10 DIAGNOSIS — Z130 Encounter for screening for diseases of the blood and blood-forming organs and certain disorders involving the immune mechanism: Secondary | ICD-10-CM | POA: Diagnosis not present

## 2020-07-10 DIAGNOSIS — Z13220 Encounter for screening for lipoid disorders: Secondary | ICD-10-CM

## 2020-07-10 DIAGNOSIS — E039 Hypothyroidism, unspecified: Secondary | ICD-10-CM

## 2020-07-10 DIAGNOSIS — Z131 Encounter for screening for diabetes mellitus: Secondary | ICD-10-CM

## 2020-07-10 DIAGNOSIS — N912 Amenorrhea, unspecified: Secondary | ICD-10-CM | POA: Diagnosis not present

## 2020-07-10 DIAGNOSIS — Z0001 Encounter for general adult medical examination with abnormal findings: Secondary | ICD-10-CM

## 2020-07-10 DIAGNOSIS — Z23 Encounter for immunization: Secondary | ICD-10-CM

## 2020-07-10 DIAGNOSIS — E669 Obesity, unspecified: Secondary | ICD-10-CM

## 2020-07-10 LAB — POCT URINE PREGNANCY: Preg Test, Ur: NEGATIVE

## 2020-07-10 MED ORDER — NORLYDA 0.35 MG PO TABS: 1.0000 | ORAL_TABLET | Freq: Every day | ORAL | 11 refills | Status: DC

## 2020-07-10 NOTE — Patient Instructions (Addendum)
Let us know your decision on colon cancer screen after your birthday- cologuard vs colonoscopy    Health Maintenance, Female Adopting a healthy lifestyle and getting preventive care are important in promoting health and wellness. Ask your health care provider about:  The right schedule for you to have regular tests and exams.  Things you can do on your own to prevent diseases and keep yourself healthy. What should I know about diet, weight, and exercise? Eat a healthy diet   Eat a diet that includes plenty of vegetables, fruits, low-fat dairy products, and lean protein.  Do not eat a lot of foods that are high in solid fats, added sugars, or sodium. Maintain a healthy weight Body mass index (BMI) is used to identify weight problems. It estimates body fat based on height and weight. Your health care provider can help determine your BMI and help you achieve or maintain a healthy weight. Get regular exercise Get regular exercise. This is one of the most important things you can do for your health. Most adults should:  Exercise for at least 150 minutes each week. The exercise should increase your heart rate and make you sweat (moderate-intensity exercise).  Do strengthening exercises at least twice a week. This is in addition to the moderate-intensity exercise.  Spend less time sitting. Even light physical activity can be beneficial. Watch cholesterol and blood lipids Have your blood tested for lipids and cholesterol at 44 years of age, then have this test every 5 years. Have your cholesterol levels checked more often if:  Your lipid or cholesterol levels are high.  You are older than 44 years of age.  You are at high risk for heart disease. What should I know about cancer screening? Depending on your health history and family history, you may need to have cancer screening at various ages. This may include screening for:  Breast cancer.  Cervical cancer.  Colorectal  cancer.  Skin cancer.  Lung cancer. What should I know about heart disease, diabetes, and high blood pressure? Blood pressure and heart disease  High blood pressure causes heart disease and increases the risk of stroke. This is more likely to develop in people who have high blood pressure readings, are of African descent, or are overweight.  Have your blood pressure checked: ? Every 3-5 years if you are 69-37 years of age. ? Every year if you are 61 years old or older. Diabetes Have regular diabetes screenings. This checks your fasting blood sugar level. Have the screening done:  Once every three years after age 11 if you are at a normal weight and have a low risk for diabetes.  More often and at a younger age if you are overweight or have a high risk for diabetes. What should I know about preventing infection? Hepatitis B If you have a higher risk for hepatitis B, you should be screened for this virus. Talk with your health care provider to find out if you are at risk for hepatitis B infection. Hepatitis C Testing is recommended for:  Everyone born from [birth year] through 1965.  Anyone with known risk factors for hepatitis C. Sexually transmitted infections (STIs)  Get screened for STIs, including gonorrhea and chlamydia, if: ? You are sexually active and are younger than 44 years of age. ? You are older than 44 years of age and your health care provider tells you that you are at risk for this type of infection. ? Your sexual activity has changed since you were  last screened, and you are at increased risk for chlamydia or gonorrhea. Ask your health care provider if you are at risk.  Ask your health care provider about whether you are at high risk for HIV. Your health care provider may recommend a prescription medicine to help prevent HIV infection. If you choose to take medicine to prevent HIV, you should first get tested for HIV. You should then be tested every 3 months for as long as  you are taking the medicine. Pregnancy  If you are about to stop having your period (premenopausal) and you may become pregnant, seek counseling before you get pregnant.  Take 400 to 800 micrograms (mcg) of folic acid every day if you become pregnant.  Ask for birth control (contraception) if you want to prevent pregnancy. Osteoporosis and menopause Osteoporosis is a disease in which the bones lose minerals and strength with aging. This can result in bone fractures. If you are 32 years old or older, or if you are at risk for osteoporosis and fractures, ask your health care provider if you should:  Be screened for bone loss.  Take a calcium or vitamin D supplement to lower your risk of fractures.  Be given hormone replacement therapy (HRT) to treat symptoms of menopause. Follow these instructions at home: Lifestyle  Do not use any products that contain nicotine or tobacco, such as cigarettes, e-cigarettes, and chewing tobacco. If you need help quitting, ask your health care provider.  Do not use street drugs.  Do not share needles.  Ask your health care provider for help if you need support or information about quitting drugs. Alcohol use  Do not drink alcohol if: ? Your health care provider tells you not to drink. ? You are pregnant, may be pregnant, or are planning to become pregnant.  If you drink alcohol: ? Limit how much you use to 0-1 drink a day. ? Limit intake if you are breastfeeding.  Be aware of how much alcohol is in your drink. In the [place] one drink equals one 12 oz bottle of beer (355 mL), one 5 oz glass of wine (148 mL), or one 1 oz glass of hard liquor (44 mL). General instructions  Schedule regular health, dental, and eye exams.  Stay current with your vaccines.  Tell your health care provider if: ? You often feel depressed. ? You have ever been abused or do not feel safe at home. Summary  Adopting a healthy lifestyle and getting preventive care are  important in promoting health and wellness.  Follow your health care provider's instructions about healthy diet, exercising, and getting tested or screened for diseases.  Follow your health care provider's instructions on monitoring your cholesterol and blood pressure. This information is not intended to replace advice given to you by your health care provider. Make sure you discuss any questions you have with your health care provider. Document Revised: 08/11/2018 Document Reviewed: 08/11/2018 Elsevier Patient Education  2020 [employer].

## 2020-07-10 NOTE — Progress Notes (Signed)
This visit occurred during the SARS-CoV-2 public health emergency.  Safety protocols were in place, including screening questions prior to the visit, additional usage of staff PPE, and extensive cleaning of exam room while observing appropriate contact time as indicated for disinfecting solutions.    Patient ID: [name] Hurst, female  DOB: February 07, 1976, 44 y.o.   MRN: [id number] Patient Care Team    Relationship Specialty Notifications Start End  Ma Hillock, DO PCP - General Family Medicine  06/30/18   Kem Boroughs, [place]  Gynecology  06/30/18   Helayne Seminole, MD Consulting Physician Otolaryngology  09/15/18     Chief Complaint  Patient presents with  . Annual Exam    Pt is not fasting; pt has GYN    Subjective:  [name] Hurst is a 44 y.o.  Female  present for CPE. All past medical history, surgical history, allergies, family history, immunizations, medications and social history were updated in the electronic medical record today. All recent labs, ED visits and hospitalizations within the last year were reviewed.   Hypothyroidism, unspecified type/thyroid cyst No longer taking levothyroxine.  Health maintenance:  Colonoscopy:no fhx, screen at 23- she will call in after her birthday and let [place] know if she decided on Cologuard versus colonoscopy.  Mammogram: completed: 2019- at [facility].  She was pregnant in 2020 and is still breast-feeding today.  Encourage restarting yearly mammograms after she finishes breast-feeding. Cervical cancer screening: last pap:09/04/2016, completed WF:UXNAT, NP Immunizations: tdapUTD 2020, Influenzapleated today(encouraged yearly, COVID series completed along with booster Infectious disease screening: HIV2017.  Hepatitis C collected today DEXA:N/A Assistive device: None Oxygen use: None Patient has a Dental home. Hospitalizations/ED visits: Reviewed   Depression screen [facility] 2/9 07/10/2020 07/08/2019 06/30/2018    Decreased Interest 0 0 0  Down, Depressed, Hopeless 0 0 0  PHQ - 2 Score 0 0 0   No flowsheet data found.   Immunization History  Administered Date(s) Administered  . Hepatitis A 06/27/2009  . Influenza,inj,Quad PF,6+ Mos 07/10/2020  . Influenza-Unspecified 06/14/2019  . PFIZER SARS-COV-2 Vaccination 10/08/2019, 10/29/2019, 06/27/2020  . Td 10/31/2007  . Typhoid Live 06/27/2009  . Varicella 10/31/2007  . Yellow Fever 06/27/2009    Past Medical History:  Diagnosis Date  . Abnormal Pap smear of cervix    12/16 neg pap HPV HR+ 16/18 neg, 1/18 neg HPV HR + genotypes 16/18/45 neg  . Anemia   . 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 HPV in female   . Hypothyroidism   . 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 S/P VSD repair 1986   While living in [place]  . Thyroid disease    No Known Allergies Past Surgical History:  Procedure Laterality Date  . COLPOSCOPY     1/18 LGSIL  . VSD REPAIR  1986   In [place]   Family History  Problem Relation Age of Onset  . Hypertension Mother   . Diabetes Mother   . Uterine cancer Mother   . Hypertension Father   . Diabetes Sister   . Breast cancer Paternal Aunt   . Diabetes Maternal Grandmother   . Ovarian cancer Sister    Social History   Social History Narrative   Marital status/children/pets: Married, 2 children. She is from [place] originally and her husband from [place].    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 If you have any questions, ask your nurse or doctor.        STOP taking these medications    Vitafol Ultra 29-0.6-0.4-200 MG Caps Stopped by: Howard Pouch, DO     TAKE these medications   Norlyda 0.35 MG tablet Generic drug: norethindrone Take 1 tablet (0.35 mg total) by mouth daily. What changed: how much to take Changed by: Howard Pouch, DO       All past medical history, surgical history, allergies, family history, immunizations andmedications were updated in the EMR today and reviewed under the history and medication portions of their EMR.     Recent Results (from the past 2160 hour(s))  POCT urine pregnancy     Status: None   Collection Time: 07/10/20  3:04 PM  Result Value Ref Range   Preg Test, Ur Negative Negative    DG Wrist Complete Left  Result Date: 11/15/2019 CLINICAL DATA:  Left wrist pain for 8 months without known injury. EXAM: LEFT WRIST - COMPLETE 3+ VIEW COMPARISON:  None. FINDINGS: There is no evidence of fracture or dislocation. There is no evidence of arthropathy or other focal bone abnormality. Soft tissues are unremarkable. IMPRESSION: Negative. Electronically Signed   By: Marijo Conception M.D.   On: 11/15/2019 15:10     ROS: 14 pt review of systems performed and negative (unless mentioned in an HPI)  Objective: BP 112/72   Pulse 68   Temp 98.3 F (36.8 C) (Oral)   Ht 5\' 4"  (1.626 m)   Wt 194 lb (88 kg)   SpO2 96%   BMI 33.30 kg/m  Gen: Afebrile. No acute distress. Nontoxic in appearance, well-developed, well-nourished, pleasant female HENT: AT. [place]. Bilateral TM visualized and normal in appearance, normal external auditory canal. MMM, no oral lesions, adequate dentition. Bilateral nares within normal limits. Throat without erythema, ulcerations or exudates.  No cough on exam, no hoarseness on exam. Eyes:Pupils Equal Round Reactive to light, Extraocular movements intact,  Conjunctiva without redness, discharge or icterus. Neck/lymp/endocrine: Supple, no lymphadenopathy, no thyromegaly CV: RRR no murmur, no edema, +2/4 P posterior tibialis  pulses.  Chest: CTAB, no wheeze, rhonchi or crackles.  Normal respiratory effort.  Good air movement. Abd: Soft.  Flat. NTND. BS present.  No masses palpated. No hepatosplenomegaly. No rebound tenderness or guarding. Skin: No rashes, purpura or petechiae. Warm and well-perfused. Skin intact. Neuro/Msk:  Normal gait. PERLA. EOMi. Alert. Oriented x3.  Cranial nerves II through XII intact. Muscle strength 5/5 upper/lower extremity. DTRs equal bilaterally. Psych: Normal affect, dress and demeanor. Normal speech. Normal thought content and judgment.   No exam data present  Assessment/plan: [name] Hurst is a 44 y.o. female present for  CPE  Amenorrhea Patient is still breast-feeding and possibly the cause of her amenorrhea.  Will rule out thyroid disorder and regnancy as cause today.  She does want to restart her birth control pills. - TSH - POCT urine pregnancy  Diabetes mellitus screening A1c collected today Lipid screening - Comprehensive metabolic panel - Lipid panel Screening for deficiency anemia - CBC with Differential/Platelet Hypothyroidism, unspecified type  Currently not taking medication - TSH Need for hepatitis C screening test - Hepatitis C Antibody  Encounter for birth control pills maintenance Patient was encouraged to exercise greater than 150 minutes a week. Patient was encouraged to choose a diet filled with fresh fruits and vegetables, and lean meats. AVS provided to patient today for education/recommendation on gender specific health and safety maintenance. Colonoscopy:no fhx, screen at 61- she will call in after her birthday and let [place] know if she decided on Cologuard versus colonoscopy.  Mammogram: completed: 2019- at [place].  She was pregnant in 2020 and is still breast-feeding today.  Encourage restarting yearly mammograms after she finishes breast-feeding. Cervical cancer screening: last pap:09/04/2016, completed NT:ZGYFV, NP Immunizations:  tdapUTD 2020, Influenzapleated today(encouraged yearly, COVID series completed along with booster Infectious disease screening: HIV2017.  Hepatitis C collected today DEXA:N/A  Return in about 1 year (around 07/10/2021) for CPE (30 min).    Orders Placed This Encounter  Procedures  . Flu Vaccine QUAD 6+ mos PF IM (Fluarix Quad PF)  . CBC with Differential/Platelet  . Comprehensive metabolic panel  . Hemoglobin A1c  . Lipid panel  . TSH  . Hepatitis C Antibody  . POCT urine pregnancy   Meds ordered this encounter  Medications  . NORLYDA 0.35 MG tablet    Sig: Take 1 tablet (0.35 mg total) by mouth daily.    Dispense:  28 tablet    Refill:  11   Referral Orders  No referral(s) requested today     Electronically signed by: Howard Pouch, [place]

## 2020-07-11 LAB — LIPID PANEL
Cholesterol: 160 mg/dL (ref 0–200)
HDL: 73.8 mg/dL (ref >39.00)
LDL Cholesterol: 74 mg/dL (ref 0–99)
NonHDL: 86.54
Total CHOL/HDL Ratio: 2
Triglycerides: 65 mg/dL (ref 0.0–149.0)
VLDL: 13 mg/dL (ref 0.0–40.0)

## 2020-07-11 LAB — CBC WITH DIFFERENTIAL/PLATELET
Basophils Absolute: 0.1 10*3/uL (ref 0.0–0.1)
Basophils Relative: 1.1 % (ref 0.0–3.0)
Eosinophils Absolute: 0.4 10*3/uL (ref 0.0–0.7)
Eosinophils Relative: 7.5 % — ABNORMAL HIGH (ref 0.0–5.0)
HCT: 38.3 % (ref 36.0–46.0)
Hemoglobin: 12.6 g/dL (ref 12.0–15.0)
Lymphocytes Relative: 41.2 % (ref 12.0–46.0)
Lymphs Abs: 2.2 10*3/uL (ref 0.7–4.0)
MCHC: 33 g/dL (ref 30.0–36.0)
MCV: 91.9 fl (ref 78.0–100.0)
Monocytes Absolute: 0.3 10*3/uL (ref 0.1–1.0)
Monocytes Relative: 6.3 % (ref 3.0–12.0)
Neutro Abs: 2.3 10*3/uL (ref 1.4–7.7)
Neutrophils Relative %: 43.9 % (ref 43.0–77.0)
Platelets: 292 10*3/uL (ref 150.0–400.0)
RBC: 4.17 Mil/uL (ref 3.87–5.11)
RDW: 13.5 % (ref 11.5–15.5)
WBC: 5.3 10*3/uL (ref 4.0–10.5)

## 2020-07-11 LAB — COMPREHENSIVE METABOLIC PANEL
ALT: 9 U/L (ref 0–35)
AST: 11 U/L (ref 0–37)
Albumin: 4.2 g/dL (ref 3.5–5.2)
Alkaline Phosphatase: 82 U/L (ref 39–117)
BUN: 10 mg/dL (ref 6–23)
CO2: 29 mEq/L (ref 19–32)
Calcium: 9.5 mg/dL (ref 8.4–10.5)
Chloride: 104 mEq/L (ref 96–112)
Creatinine, Ser: 0.69 mg/dL (ref 0.40–1.20)
GFR: 105.28 mL/min (ref >60.00)
Glucose, Bld: 88 mg/dL (ref 70–99)
Potassium: 4.1 mEq/L (ref 3.5–5.1)
Sodium: 140 mEq/L (ref 135–145)
Total Bilirubin: 0.5 mg/dL (ref 0.2–1.2)
Total Protein: 6.9 g/dL (ref 6.0–8.3)

## 2020-07-11 LAB — HEPATITIS C ANTIBODY
Hepatitis C Ab: NONREACTIVE
SIGNAL TO CUT-OFF: 0.01 (ref <1.00)

## 2020-07-11 LAB — TSH: TSH: 1.54 u[IU]/mL (ref 0.35–4.50)

## 2020-07-11 LAB — HEMOGLOBIN A1C: Hgb A1c MFr Bld: 5.8 % (ref 4.6–6.5)

## 2020-07-13 ENCOUNTER — Telehealth: Payer: Self-pay

## 2020-07-13 NOTE — Telephone Encounter (Signed)
Completed and returned to [place] work desk

## 2020-07-13 NOTE — Telephone Encounter (Signed)
Pt aware and will pick up on Monday. Will fax form today

## 2020-07-13 NOTE — Telephone Encounter (Signed)
On providers desk

## 2020-07-13 NOTE — Telephone Encounter (Signed)
Patient wife dropped off forms to be completed for herself and her husband.  She is a patient of Dr. Raoul Pitch.   Deputy for health screening.  Last physical was on 07/10/2020  Gave form to [place]. Patient wife is to be contacted when available for pick up [place] cell is [phone].

## 2020-07-13 NOTE — Telephone Encounter (Signed)
Awaiting forms

## 2020-08-07 ENCOUNTER — Encounter: Payer: 59 | Admitting: Family Medicine

## 2021-07-02 ENCOUNTER — Telehealth: Payer: Self-pay | Admitting: Family Medicine

## 2021-07-02 NOTE — Telephone Encounter (Signed)
Awaiting triage note.  

## 2021-07-02 NOTE — Telephone Encounter (Signed)
Pt called to schedule an appt and said she was having chest pains and chest tightness so I transferred her to the triage nurse

## 2021-07-03 NOTE — Telephone Encounter (Signed)
Called pt who stated that her sx has resolved and that she will follow with her cardiologist. Pt is aware if experience those sx again she is to go to the emergency room.

## 2021-07-03 NOTE — Telephone Encounter (Signed)
[name] KO SIE-WILLIA MS Gender: Female DOB: 11/04/1975 Age: 45 Y 9 M 6 D Return Phone Number: [phone] (Primary) Address: City/ State/ Zip: [place]  [address] Client [facility] - Client Client Site [place] - Day Physician Raoul Pitch, [place] Contact Type Call Who Is Calling Patient / Member / Family / Caregiver Call Type Triage / Clinical Relationship To Patient Self Return Phone Number [phone] (Primary) Chief Complaint CHEST PAIN - pain, pressure, heaviness or tightness Reason for Call Symptomatic / Request for [place] states she's having chest pain. Translation No Nurse Assessment Nurse: Marcello Moores, RN, Cheri Date/Time (Eastern Time): 07/02/2021 4:25:56 PM Confirm and document reason for call. If symptomatic, describe symptoms. ---Caller states she has chest tightness. States there was a death in the family and it happens more when she cries. Does the patient have any new or worsening symptoms? ---Yes Will a triage be completed? ---Yes Related visit to physician within the last 2 weeks? ---No Does the PT have any chronic conditions? (i.e. diabetes, asthma, this includes High risk factors for pregnancy, etc.) ---No Is the patient pregnant or possibly pregnant? (Ask all females between the ages of 73-55) ---No Is this a behavioral health or substance abuse call? ---No Guidelines Guideline Title Affirmed Question Affirmed Notes Nurse Date/Time (Eastern Time) Chest Pain [1] Chest pain (or "angina") comes and goes AND [2] is happening more often (increasing in frequency) or getting worse (increasing in severity) (Exception: chest pains that last only a few seconds) Marcello Moores, RN, Cheri 07/02/2021 4:27:19 PM PLEASE NOTE: All timestamps contained within this report are represented as [place] Standard Time. CONFIDENTIALTY NOTICE: This fax transmission is intended only for the  addressee. It contains information that is legally privileged, confidential or otherwise protected from use or disclosure. If you are not the intended recipient, you are strictly prohibited from reviewing, disclosing, copying using or disseminating any of this information or taking any action in reliance on or regarding this information. If you have received this fax in error, please notify us immediately by telephone so that we can arrange for its return to [place]. Phone: [phone], Toll-Free: [phone], Fax: [phone] Page: 2 of 2 Call Id: [id number] [place]. Time Eilene Ghazi Time) Disposition Final User 07/02/2021 4:24:41 PM Send to Urgent [place] D[place], [place] 07/02/2021 4:31:52 PM Go to ED Now Yes Marcello Moores, RN, Cheri Caller Disagree/Comply Comply Caller Understands Yes PreDisposition Go to ED Care Advice Given Per Guideline GO TO ED NOW: * You need to be seen in the Emergency Department. * Go to the ED at ___________ [place] now. Drive carefully. ANOTHER ADULT SHOULD DRIVE: * It is better and safer if another adult drives instead of you. BRING MEDICINES: * Bring a list of your current medicines when you go to the Emergency Department (ER). NOTHING BY MOUTH: * Do not eat or drink anything for now. CALL EMS IF: * Severe difficulty breathing occurs * Passes out or becomes too weak to stand * You become worse CARE ADVICE given per Chest Pain (Adult) guideline. Comments User: Marvis Repress, RN Date/Time Eilene Ghazi Time): 07/02/2021 4:29:59 PM childhood cardiac surgery for "hole in heart"

## 2021-07-30 ENCOUNTER — Ambulatory Visit: Payer: 59 | Admitting: Internal Medicine

## 2021-07-30 ENCOUNTER — Ambulatory Visit (INDEPENDENT_AMBULATORY_CARE_PROVIDER_SITE_OTHER): Payer: 59

## 2021-07-30 ENCOUNTER — Encounter: Payer: Self-pay | Admitting: Internal Medicine

## 2021-07-30 ENCOUNTER — Other Ambulatory Visit: Payer: Self-pay

## 2021-07-30 VITALS — BP 124/80 | HR 60 | Temp 98.6°F | Ht 64.0 in | Wt 188.0 lb

## 2021-07-30 DIAGNOSIS — R0689 Other abnormalities of breathing: Secondary | ICD-10-CM

## 2021-07-30 DIAGNOSIS — R059 Cough, unspecified: Secondary | ICD-10-CM | POA: Diagnosis not present

## 2021-07-30 DIAGNOSIS — J069 Acute upper respiratory infection, unspecified: Secondary | ICD-10-CM | POA: Insufficient documentation

## 2021-07-30 DIAGNOSIS — Z01118 Encounter for examination of ears and hearing with other abnormal findings: Secondary | ICD-10-CM

## 2021-07-30 LAB — POCT INFLUENZA A/B
Influenza A, POC: NEGATIVE
Influenza B, POC: NEGATIVE

## 2021-07-30 LAB — POC COVID19 BINAXNOW: SARS Coronavirus 2 Ag: NEGATIVE

## 2021-07-30 MED ORDER — LEVOFLOXACIN 500 MG PO TABS: 500.0000 mg | ORAL_TABLET | Freq: Every day | ORAL | 0 refills | Status: AC

## 2021-07-30 MED ORDER — HYDROCODONE BIT-HOMATROP MBR 5-1.5 MG/5ML PO SOLN: 5.0000 mL | Freq: Four times a day (QID) | ORAL | 0 refills | Status: AC | PRN

## 2021-07-30 NOTE — Progress Notes (Signed)
Patient ID: [name] Hurst, female   DOB: 1976-08-02, 45 y.o.   MRN: [id number]        Chief Complaint: URi symptoms       HPI:  [name] Hurst is a 45 y.o. female here with c/o URI symptoms  -  Here with 7 days acute onset fever, facial pain, pressure, headache, general weakness and malaise, and greenish d/c, with mild ST and scant cough, but pt denies chest pain, wheezing, increased sob or doe, orthopnea, PND, increased LE swelling, palpitations, dizziness or syncope.  Just back from [place] where a 70 yo nephew recently died of non covid illness similar to this to start it seemed.   Pt denies polydipsia, polyuria, or new focal neuro s/s.    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 She has never used smokeless tobacco. She reports current alcohol use. She reports that she does not use drugs. family history includes Breast cancer in her paternal aunt; Diabetes in her maternal grandmother, mother, and sister; Hypertension in her father and mother; Ovarian cancer in her sister; Uterine cancer in her mother. No Known Allergies Current Outpatient Medications on File Prior to Visit   Medication Sig Dispense Refill   NORLYDA 0.35 MG tablet Take 1 tablet (0.35 mg total) by mouth daily. 28 tablet 11   No current facility-administered medications on file prior to visit.        ROS:  All others reviewed and negative.  Objective        PE:  BP 124/80 (BP Location: Right Arm, Patient Position: Sitting, Cuff Size: Large)   Pulse 60   Temp 98.6 F (37 C) (Oral)   Ht 5\' 4"  (1.626 m)   Wt 188 lb (85.3 kg)   LMP 07/28/2021 (Approximate)   SpO2 98%   BMI 32.27 kg/m                 Constitutional: Pt appears in NAD but mild ill               HENT: Head: NCAT.                Right Ear: External ear normal.                 Left Ear: External ear normal.                Eyes: . Pupils are equal, round, and reactive to  light. Conjunctivae and EOM are normal;  Bilat tm's with mild erythema.  Max sinus areas mild tender.  Pharynx with mild erythema, no exudate               Nose: without d/c or deformity               Neck: Neck supple. Gross normal ROM               Cardiovascular: Normal rate and regular rhythm.                 Pulmonary/Chest: Effort normal and breath sounds with mild few mid right lung field rales and trace wheezing.                               Neurological: Pt is alert. At baseline orientation, motor grossly intact               Skin: Skin is warm. No rashes, no other new lesions, LE edema - none               Psychiatric: Pt behavior is normal without agitation   Micro: none  Cardiac tracings I have personally interpreted today:  none  Pertinent Radiological findings (summarize): none   Lab Results  Component Value Date   WBC 5.3 07/10/2020   HGB 12.6 07/10/2020   HCT 38.3 07/10/2020   PLT 292.0 07/10/2020   GLUCOSE 88 07/10/2020   CHOL 160 07/10/2020   TRIG 65.0 07/10/2020   HDL 73.80 07/10/2020   LDLCALC 74 07/10/2020   ALT 9 07/10/2020   AST 11 07/10/2020   NA 140 07/10/2020   K 4.1 07/10/2020   CL 104 07/10/2020   CREATININE 0.69  07/10/2020   BUN 10 07/10/2020   CO2 29 07/10/2020   TSH 1.54 07/10/2020   INR 1.07 07/22/2010   HGBA1C 5.8 07/10/2020   SARS Coronavirus 2 Ag Negative Negative    Influenza A, POC Negative Negative  Negative   Influenza B, POC Negative Negative  Negative    Assessment/Plan:  [name] Hurst is a 45 y.o. 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  URI (upper respiratory infection) Mild to mod, for antibx course, cough med prn,  to f/u any worsening symptoms or concerns  Abnormal breath sounds Cant r/o pna  - for cxr,  to f/u any worsening symptoms or concerns  Abnormal otoscopic exam of left ear Has left eustachian tube dysfxn symptoms as well - for mucinex bid prn  Followup: Return if symptoms worsen or fail to improve.  Cathlean Cower, MD 07/30/2021 8:03 PM [facility]

## 2021-07-30 NOTE — Assessment & Plan Note (Signed)
Cant r/o pna  - for cxr,  to f/u any worsening symptoms or concerns

## 2021-07-30 NOTE — Assessment & Plan Note (Addendum)
Mild to mod, for antibx course, cough med prn, to f/u any worsening symptoms or concerns 

## 2021-07-30 NOTE — Assessment & Plan Note (Signed)
Has left eustachian tube dysfxn symptoms as well - for mucinex bid prn

## 2021-07-30 NOTE — Patient Instructions (Signed)
Please take all new medication as prescribed  - the antibiotic, and cough medicne  Please continue all other medications as before, and refills have been done if requested.  Please have the pharmacy call with any other refills you may need.  Please keep your appointments with your specialists as you may have planned  Please go to the XRAY Department in the first floor for the x-ray testing  You will be contacted by phone if any changes need to be made immediately.  Otherwise, you will receive a letter about your results with an explanation, but please check with MyChart first.  Please remember to sign up for MyChart if you have not done so, as this will be important to you in the future with finding out test results, communicating by private email, and scheduling acute appointments online when needed.

## 2021-07-31 ENCOUNTER — Encounter: Payer: Self-pay | Admitting: Internal Medicine

## 2021-08-19 ENCOUNTER — Ambulatory Visit: Payer: 59

## 2021-08-22 ENCOUNTER — Encounter: Payer: 59 | Admitting: Family Medicine

## 2021-09-30 ENCOUNTER — Telehealth: Payer: Self-pay

## 2021-09-30 ENCOUNTER — Encounter: Payer: 59 | Admitting: Family Medicine

## 2021-09-30 NOTE — Telephone Encounter (Signed)
I spoke to patient regarding her appt today with Dr. Raoul Pitch, and also her daughter's appt with Dr. Raoul Pitch. Both appts were physicals, 2pm and 3pm  Patient stated that there was a death in the family.  She could not reschedule but will call back later.

## 2021-09-30 NOTE — Telephone Encounter (Signed)
noted 

## 2022-06-20 ENCOUNTER — Ambulatory Visit (INDEPENDENT_AMBULATORY_CARE_PROVIDER_SITE_OTHER): Payer: 59

## 2022-06-20 DIAGNOSIS — Z23 Encounter for immunization: Secondary | ICD-10-CM | POA: Diagnosis not present

## 2022-06-25 ENCOUNTER — Ambulatory Visit: Payer: 59 | Admitting: Family Medicine

## 2022-06-25 NOTE — Progress Notes (Deleted)
[name] Hurst , 05-08-1976, 46 y.o., female MRN: [id number] Patient Care Team    Relationship Specialty Notifications Start End  Ma Hillock, DO PCP - General Family Medicine  06/30/18   Kem Boroughs, [place]  Gynecology  06/30/18   Helayne Seminole, MD Consulting Physician Otolaryngology  09/15/18     No chief complaint on file.    Subjective: Pt presents for an OV with complaints of *** of *** duration.  Associated symptoms include ***.  Pt has tried *** to ease their symptoms.      07/10/2020    2:02 PM 07/08/2019    1:06 PM 06/30/2018    8:38 AM  Depression screen PHQ 2/9  Decreased Interest 0 0 0  Down, Depressed, Hopeless 0 0 0  PHQ - 2 Score 0 0 0    No Known Allergies Social History   Social History Narrative   Marital status/children/pets: Married, 2 children. She is from [place] originally and her husband from [place].    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 If you have any questions, ask your nurse or doctor.          Norlyda 0.35 MG tablet Generic drug: norethindrone Take 1 tablet (0.35 mg total) by mouth daily.        All past medical history, surgical history, allergies, family history, immunizations andmedications were updated in the EMR today and reviewed under the history and medication portions of their EMR.     ROS Negative, with the exception of above mentioned in HPI   Objective:  There were no vitals taken for this visit. There is no height or weight on file to calculate BMI.  Physical Exam   No results found. No results found. No results found for this or any previous visit (from the past 24 hour(s)).  Assessment/Plan: [name] Hurst is a 46 y.o. female present for OV for  *** Reviewed expectations re: course of current medical issues.  Discussed self-management of symptoms. Outlined signs and symptoms indicating need for more acute intervention. Patient verbalized understanding and all questions were answered. Patient received an After-Visit Summary.    No orders of the defined types were placed in this encounter.  No orders of the defined types were placed in this encounter.  Referral Orders  No referral(s) requested today     Note is dictated utilizing voice recognition software. Although note has been proof read prior to signing, occasional typographical errors still can be missed. If any questions arise, please do not hesitate to call for verification.   electronically signed by:  Howard Pouch, DO  [place]

## 2022-06-25 NOTE — Progress Notes (Deleted)
[name] Hurst , 02-02-1976, 46 y.o., female MRN: [id number] Patient Care Team    Relationship Specialty Notifications Start End  [name] Hillock, [name] Hurst PCP - General Family Medicine  06/30/18   [name] Hurst, [name] Hurst  Gynecology  06/30/18   [name] Seminole, [name] Hurst Consulting Physician Otolaryngology  09/15/18     No chief complaint on file.    Subjective: Pt presents for an OV with complaints of *** of *** duration.  Associated symptoms include ***.  Pt has tried *** to ease their symptoms.      07/10/2020    2:02 PM 07/08/2019    1:06 PM 06/30/2018    8:38 AM  Depression screen PHQ 2/9  Decreased Interest 0 0 0  Down, Depressed, Hopeless 0 0 0  PHQ - 2 Score 0 0 0    No Known Allergies Social History   Social History Narrative   Marital status/children/pets: Married, 2 children. She is from [place] originally and her husband from [place].    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 If you have any questions, ask your nurse or doctor.          Norlyda 0.35 MG tablet Generic drug: norethindrone Take 1 tablet (0.35 mg total) by mouth daily.        All past medical history, surgical history, allergies, family history, immunizations andmedications were updated in the EMR today and reviewed under the history and medication portions of their EMR.     ROS Negative, with the exception of above mentioned in HPI   Objective:  There were no vitals taken for this visit. There is no height or weight on file to calculate BMI.  Physical Exam   No results found. No results found. No results found for this or any previous visit (from the past 24 hour(s)).  Assessment/Plan: [name] Hurst is a 46 y.o. female present for OV for  *** Reviewed expectations re: course of current medical  issues. Discussed self-management of symptoms. Outlined signs and symptoms indicating need for more acute intervention. Patient verbalized understanding and all questions were answered. Patient received an After-Visit Summary.    No orders of the defined types were placed in this encounter.  No orders of the defined types were placed in this encounter.  Referral Orders  No referral(s) requested today     Note is dictated utilizing voice recognition software. Although note has been proof read prior to signing, occasional typographical errors still can be missed. If any questions arise, please [name] Hurst not hesitate to call for verification.   electronically signed by:  [name] Pouch, [name] Hurst  [place]

## 2022-07-11 ENCOUNTER — Encounter: Payer: Self-pay | Admitting: Family Medicine

## 2022-07-11 ENCOUNTER — Ambulatory Visit (INDEPENDENT_AMBULATORY_CARE_PROVIDER_SITE_OTHER): Payer: 59 | Admitting: Family Medicine

## 2022-07-11 VITALS — BP 113/75 | HR 53 | Temp 98.1°F | Ht 64.76 in | Wt 199.0 lb

## 2022-07-11 DIAGNOSIS — E669 Obesity, unspecified: Secondary | ICD-10-CM

## 2022-07-11 DIAGNOSIS — Z0000 Encounter for general adult medical examination without abnormal findings: Secondary | ICD-10-CM

## 2022-07-11 DIAGNOSIS — Z1231 Encounter for screening mammogram for malignant neoplasm of breast: Secondary | ICD-10-CM | POA: Diagnosis not present

## 2022-07-11 DIAGNOSIS — E039 Hypothyroidism, unspecified: Secondary | ICD-10-CM | POA: Diagnosis not present

## 2022-07-11 DIAGNOSIS — Z793 Long term (current) use of hormonal contraceptives: Secondary | ICD-10-CM

## 2022-07-11 DIAGNOSIS — Z1211 Encounter for screening for malignant neoplasm of colon: Secondary | ICD-10-CM

## 2022-07-11 LAB — LIPID PANEL
Cholesterol: 167 mg/dL (ref 0–200)
HDL: 61.9 mg/dL (ref >39.00)
LDL Cholesterol: 91 mg/dL (ref 0–99)
NonHDL: 104.6
Total CHOL/HDL Ratio: 3
Triglycerides: 67 mg/dL (ref 0.0–149.0)
VLDL: 13.4 mg/dL (ref 0.0–40.0)

## 2022-07-11 LAB — CBC WITH DIFFERENTIAL/PLATELET
Basophils Absolute: 0.1 10*3/uL (ref 0.0–0.1)
Basophils Relative: 1.2 % (ref 0.0–3.0)
Eosinophils Absolute: 0.2 10*3/uL (ref 0.0–0.7)
Eosinophils Relative: 4.9 % (ref 0.0–5.0)
HCT: 35.9 % — ABNORMAL LOW (ref 36.0–46.0)
Hemoglobin: 11.5 g/dL — ABNORMAL LOW (ref 12.0–15.0)
Lymphocytes Relative: 60.8 % — ABNORMAL HIGH (ref 12.0–46.0)
Lymphs Abs: 2.8 10*3/uL (ref 0.7–4.0)
MCHC: 32.2 g/dL (ref 30.0–36.0)
MCV: 90.7 fl (ref 78.0–100.0)
Monocytes Absolute: 0.3 10*3/uL (ref 0.1–1.0)
Monocytes Relative: 6 % (ref 3.0–12.0)
Neutro Abs: 1.3 10*3/uL — ABNORMAL LOW (ref 1.4–7.7)
Neutrophils Relative %: 27.1 % — ABNORMAL LOW (ref 43.0–77.0)
Platelets: 282 10*3/uL (ref 150.0–400.0)
RBC: 3.96 Mil/uL (ref 3.87–5.11)
RDW: 14.1 % (ref 11.5–15.5)
WBC: 4.6 10*3/uL (ref 4.0–10.5)

## 2022-07-11 LAB — COMPREHENSIVE METABOLIC PANEL
ALT: 9 U/L (ref 0–35)
AST: 10 U/L (ref 0–37)
Albumin: 4.3 g/dL (ref 3.5–5.2)
Alkaline Phosphatase: 73 U/L (ref 39–117)
BUN: 10 mg/dL (ref 6–23)
CO2: 28 mEq/L (ref 19–32)
Calcium: 9.1 mg/dL (ref 8.4–10.5)
Chloride: 106 mEq/L (ref 96–112)
Creatinine, Ser: 0.7 mg/dL (ref 0.40–1.20)
GFR: 103.45 mL/min (ref >60.00)
Glucose, Bld: 96 mg/dL (ref 70–99)
Potassium: 4 mEq/L (ref 3.5–5.1)
Sodium: 140 mEq/L (ref 135–145)
Total Bilirubin: 0.4 mg/dL (ref 0.2–1.2)
Total Protein: 6.9 g/dL (ref 6.0–8.3)

## 2022-07-11 LAB — TSH: TSH: 4.33 u[IU]/mL (ref 0.35–5.50)

## 2022-07-11 LAB — HEMOGLOBIN A1C: Hgb A1c MFr Bld: 6.1 % (ref 4.6–6.5)

## 2022-07-11 NOTE — Progress Notes (Signed)
Patient ID: [name] Hurst, female  DOB: 06-27-1976, 46 y.o.   MRN: [id number] Patient Care Team    Relationship Specialty Notifications Start End  Ma Hillock, DO PCP - General Family Medicine  06/30/18   Kem Boroughs, [place]  Gynecology  06/30/18   Helayne Seminole, MD Consulting Physician Otolaryngology  09/15/18     Chief Complaint  Patient presents with   Annual Exam    Pt is not fasting    Subjective:  [name] Hurst is a 46 y.o.  Female  present for CPE. All past medical history, surgical history, allergies, family history, immunizations, medications and social history were updated in the electronic medical record today. All recent labs, ED visits and hospitalizations within the last year were reviewed.   Hypothyroidism, unspecified type/thyroid cyst No longer taking levothyroxine. TSH remained stable last check  Health maintenance:  Colonoscopy: no fhx, screen due; cologuard ordered Mammogram: completed: 2019- at [facility].  She has been pregnant or BF since that time>ordered for her [facility] Cervical cancer screening: last pap: 09/04/2016, completed by: Raquel Sarna, NP- records requested Immunizations: tdap UTD 2020, Influenza UTD 06/2022 (encouraged yearly) Infectious disease screening: HIV screen 2017.  Hepatitis C collected today DEXA: N/A Assistive device: None Oxygen use: None Patient has a Dental home. Hospitalizations/ED visits: Reviewed      07/11/2022    8:08 AM 07/10/2020    2:02 PM 07/08/2019    1:06 PM 06/30/2018    8:38 AM  Depression screen PHQ 2/9  Decreased Interest 1 0 0 0  Down, Depressed, Hopeless 1 0 0 0  PHQ - 2 Score 2 0 0 0  Altered sleeping 0     Tired, decreased energy 0     Change in appetite 0     Feeling bad or failure about yourself  0     Trouble concentrating 0     Moving slowly or fidgety/restless 0     Suicidal thoughts 0     PHQ-9 Score 2         07/11/2022    8:08 AM  GAD 7 : Generalized  Anxiety Score  Nervous, Anxious, on Edge 1  Control/stop worrying 1  Worry too much - different things 0  Trouble relaxing 1  Restless 0  Easily annoyed or irritable 0  Afraid - awful might happen 0  Total GAD 7 Score 3     Immunization History  Administered Date(s) Administered   Hepatitis A 06/27/2009   Influenza,inj,Quad PF,6+ Mos 07/10/2020, 06/20/2022   Influenza-Unspecified 06/14/2019   PFIZER(Purple Top)SARS-COV-2 Vaccination 10/08/2019, 10/29/2019, 06/27/2020   Td 10/31/2007   Typhoid Live 06/27/2009   Varicella 10/31/2007   Yellow Fever 06/27/2009    Past Medical History:  Diagnosis Date   Abnormal Pap smear of cervix    12/16 neg pap HPV HR+ 16/18 neg, 1/18 neg HPV HR + genotypes 16/18/45 neg   Anemia    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   bilateral lobes, small cyst, benign, no follow up needed   HPV in female    Hypothyroidism    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 She is from [place] originally and her husband from [place].    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 If you have any questions, ask your nurse or doctor.          STOP taking these medications    Norlyda 0.35 MG tablet Generic drug: norethindrone Stopped by: Howard Pouch, DO        All past medical history, surgical history, allergies, family history, immunizations andmedications were updated in the EMR today and reviewed under the history and medication portions of their EMR.     No results found for this or any previous visit (from the past 2160 hour(s)).    ROS: 14 pt review of systems performed and negative (unless mentioned in an HPI)  Objective: BP 113/75   Pulse (!) 53   Temp 98.1 F (36.7 C)   Ht 5' 4.76" (1.645 m)   Wt 199 lb (90.3 kg)   SpO2 99%   BMI 33.36 kg/m  Physical Exam Vitals and nursing note reviewed.  Constitutional:      General: She is not in acute distress.    Appearance: Normal appearance. She is not ill-appearing or toxic-appearing.  HENT:     Head: Normocephalic and atraumatic.     Right Ear: Tympanic membrane, ear canal and external ear normal. There is no impacted cerumen.     Left Ear: Tympanic membrane, ear canal and external ear normal. There is no impacted cerumen.     Nose: No congestion or rhinorrhea.     Mouth/Throat:     Mouth: Mucous membranes are moist.     Pharynx: Oropharynx is clear. No oropharyngeal exudate or posterior oropharyngeal erythema.  Eyes:     General: No scleral icterus.       Right eye: No discharge.        Left eye: No discharge.     Extraocular Movements: Extraocular movements intact.     Conjunctiva/sclera: Conjunctivae normal.     Pupils: Pupils are equal, round, and reactive to light.  Cardiovascular:     Rate and Rhythm: Normal rate and regular rhythm.     Pulses: Normal pulses.     Heart sounds: Normal heart sounds. No murmur heard.    No friction rub. No gallop.  Pulmonary:     Effort: Pulmonary effort is normal. No respiratory distress.     Breath sounds: Normal breath  sounds. No stridor. No wheezing, rhonchi or rales.  Chest:     Chest wall: No tenderness.  Abdominal:     General: Abdomen is flat. Bowel sounds are normal. There is no distension.     Palpations: Abdomen  is soft. There is no mass.     Tenderness: There is no abdominal tenderness. There is no right CVA tenderness, left CVA tenderness, guarding or rebound.     Hernia: No hernia is present.  Musculoskeletal:        General: No swelling, tenderness or deformity. Normal range of motion.     Cervical back: Normal range of motion and neck supple. No rigidity or tenderness.     Right lower leg: No edema.     Left lower leg: No edema.  Lymphadenopathy:     Cervical: No cervical adenopathy.  Skin:    General: Skin is warm and dry.     Coloration: Skin is not jaundiced or pale.     Findings: No bruising, erythema, lesion or rash.  Neurological:     General: No focal deficit present.     Mental Status: She is alert and oriented to person, place, and time. Mental status is at baseline.     Cranial Nerves: No cranial nerve deficit.     Sensory: No sensory deficit.     Motor: No weakness.     Coordination: Coordination normal.     Gait: Gait normal.     Deep Tendon Reflexes: Reflexes normal.  Psychiatric:        Mood and Affect: Mood normal.        Behavior: Behavior normal.        Thought Content: Thought content normal.        Judgment: Judgment normal.      No results found.  Assessment/plan: Timya Trimmer is a 46 y.o. female present for CPE Obesity (BMI 30-39.9) - Lipid panel- not fasting Hypothyroidism, unspecified type - Lipid panel - TSH Breast cancer screening by mammogram - MM 3D SCREEN BREAST BILATERAL; Future Colon cancer screening - Cologuard Long term current use of hormonal contraceptive - CBC with Differential/Platelet - Comprehensive metabolic panel - Hemoglobin A1c - Lipid panel Routine general medical examination at a health care  facility Colonoscopy: no fhx, screen due; cologuard ordered Mammogram: completed: 2019- at [facility].  She has been pregnant or BF since that time>ordered for her todayM[facility]P Cervical cancer screening: last pap: 09/04/2016, completed by: Raquel Sarna, NP- records requested Immunizations: tdap UTD 2020, Influenza UTD 06/2022 (encouraged yearly) Infectious disease screening: HIV screen 2017.  Hepatitis C collected today DEXA: N/A Patient was encouraged to exercise greater than 150 minutes a week. Patient was encouraged to choose a diet filled with fresh fruits and vegetables, and lean meats. AVS provided to patient today for education/recommendation on gender specific health and safety maintenance.   Return in about 1 year (around 07/13/2023) for cpe (20 min).    Orders Placed This Encounter  Procedures   MM 3D SCREEN BREAST BILATERAL   CBC with Differential/Platelet   Comprehensive metabolic panel   Hemoglobin A1c   Lipid panel   TSH   Cologuard   No orders of the defined types were placed in this encounter.  Referral Orders  No referral(s) requested today     Electronically signed by: Howard Pouch, [place]

## 2022-07-11 NOTE — Patient Instructions (Addendum)

## 2022-07-14 ENCOUNTER — Telehealth: Payer: Self-pay | Admitting: Family Medicine

## 2022-07-14 DIAGNOSIS — D72820 Lymphocytosis (symptomatic): Secondary | ICD-10-CM

## 2022-07-14 NOTE — Telephone Encounter (Signed)
Attempted to contact pt and was unable to LVM 

## 2022-07-14 NOTE — Telephone Encounter (Signed)
Please call patient Liver, kidney, electrolytes and thyroid function are normal Diabetes screening/A1c is elevated from prior at 6.1 with a normal glucose.  This is mildly in the prediabetic range.  I would encourage her to obtain routine exercise at least 150 minutes a week and avoid consuming high sugary content foods. Cholesterol panel looks and is at goal for her.  Her blood cell counts were stable with the exception of a so-called lymphocytes appeared to be higher than average.  This could be due to an acute illness.  I would recommend following up in 4 weeks for lab appointment only to have this rechecked.  If they are again high we would need to bring her back for an appointment and further evaluate for cause.   Lab appointment 4 weeks

## 2022-07-15 NOTE — Telephone Encounter (Signed)
Spoke with patient regarding results/recommendations.  

## 2022-07-18 ENCOUNTER — Telehealth: Payer: Self-pay

## 2022-07-18 NOTE — Telephone Encounter (Signed)
Biometric forms completed and placed on PCP desk

## 2022-07-18 NOTE — Telephone Encounter (Signed)
Faxed

## 2022-07-18 NOTE — Telephone Encounter (Signed)
Completed.

## 2022-08-12 ENCOUNTER — Other Ambulatory Visit: Payer: 59

## 2022-08-12 LAB — COLOGUARD: COLOGUARD: NEGATIVE

## 2022-08-15 ENCOUNTER — Other Ambulatory Visit (INDEPENDENT_AMBULATORY_CARE_PROVIDER_SITE_OTHER): Payer: 59

## 2022-08-15 DIAGNOSIS — D72820 Lymphocytosis (symptomatic): Secondary | ICD-10-CM

## 2022-08-15 LAB — CBC WITH DIFFERENTIAL/PLATELET
Basophils Absolute: 0 10*3/uL (ref 0.0–0.1)
Basophils Relative: 0.9 % (ref 0.0–3.0)
Eosinophils Absolute: 0.2 10*3/uL (ref 0.0–0.7)
Eosinophils Relative: 4.6 % (ref 0.0–5.0)
HCT: 36.1 % (ref 36.0–46.0)
Hemoglobin: 12 g/dL (ref 12.0–15.0)
Lymphocytes Relative: 56.9 % — ABNORMAL HIGH (ref 12.0–46.0)
Lymphs Abs: 2.3 10*3/uL (ref 0.7–4.0)
MCHC: 33.2 g/dL (ref 30.0–36.0)
MCV: 90.6 fl (ref 78.0–100.0)
Monocytes Absolute: 0.3 10*3/uL (ref 0.1–1.0)
Monocytes Relative: 6.1 % (ref 3.0–12.0)
Neutro Abs: 1.3 10*3/uL — ABNORMAL LOW (ref 1.4–7.7)
Neutrophils Relative %: 31.5 % — ABNORMAL LOW (ref 43.0–77.0)
Platelets: 300 10*3/uL (ref 150.0–400.0)
RBC: 3.99 Mil/uL (ref 3.87–5.11)
RDW: 14.3 % (ref 11.5–15.5)
WBC: 4.1 10*3/uL (ref 4.0–10.5)

## 2022-08-18 ENCOUNTER — Telehealth: Payer: Self-pay | Admitting: Family Medicine

## 2022-08-18 DIAGNOSIS — D72820 Lymphocytosis (symptomatic): Secondary | ICD-10-CM | POA: Insufficient documentation

## 2022-08-18 NOTE — Telephone Encounter (Signed)
Please inform patient her lab results are slowly improving. She still has higher percent level of lymphocytes compared to normal. This could be because an acute illness she had over the last few months.  Or it could be a condition involving lymphocytes. I would encourage her to follow-up in 4 weeks with provider and we will recheck the blood work again to ensure it is trending towards normal, if not we would need to get her to a hematologist for further evaluation.

## 2022-08-19 ENCOUNTER — Ambulatory Visit (HOSPITAL_BASED_OUTPATIENT_CLINIC_OR_DEPARTMENT_OTHER)
Admission: RE | Admit: 2022-08-19 | Discharge: 2022-08-19 | Disposition: A | Discharge status: Home / Self Care | Payer: 59 | Source: Ambulatory Visit | Attending: Family Medicine | Admitting: Family Medicine

## 2022-08-19 ENCOUNTER — Encounter (HOSPITAL_BASED_OUTPATIENT_CLINIC_OR_DEPARTMENT_OTHER): Payer: Self-pay

## 2022-08-19 DIAGNOSIS — Z1231 Encounter for screening mammogram for malignant neoplasm of breast: Secondary | ICD-10-CM | POA: Diagnosis present

## 2022-08-19 NOTE — Telephone Encounter (Signed)
Spoke with pt regarding labs and instructions.   

## 2023-05-19 ENCOUNTER — Encounter: Payer: Self-pay | Admitting: Adult Health

## 2023-05-19 ENCOUNTER — Ambulatory Visit: Payer: No Typology Code available for payment source | Admitting: Adult Health

## 2023-05-19 VITALS — BP 120/80 | HR 59 | Temp 98.6°F | Ht 64.75 in | Wt 194.0 lb

## 2023-05-19 DIAGNOSIS — Z23 Encounter for immunization: Secondary | ICD-10-CM | POA: Diagnosis not present

## 2023-05-19 DIAGNOSIS — H6993 Unspecified Eustachian tube disorder, bilateral: Secondary | ICD-10-CM

## 2023-05-19 DIAGNOSIS — T148XXA Other injury of unspecified body region, initial encounter: Secondary | ICD-10-CM

## 2023-05-19 NOTE — Progress Notes (Signed)
Subjective:    Patient ID: [name] Hurst, female    DOB: [birth year], 47 y.o.   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  She presents to the office today for multiple issues.  She reports that when she woke up this morning she noticed fluid-filled blister on her right upper arm as well as a smaller fluid-filled blister on her abdomen close to her umbilicus.  She has not had any burning pain, redness, or warmth around the blister.  She does report using a heating pad last night but does not believe that she burned herself.  He feels as though this has been improving throughout the day not noticed any new blisters since when she woke up   More she feels for the last unknown amount of time that she has fluid in her ears stating "it is like I am underwater and voices and sounds are muffled".  She has not had any drainage.  Recently got over a cold.     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    S/P VSD repair 1986   While living in [place]   Thyroid disease     Social History   Socioeconomic History   Marital status: Married    Spouse name: Not on file   Number of children: Not on file   Years of education: Not on file   Highest education level: Not on file  Occupational History   Not on file  Tobacco Use   Smoking status: Never   Smokeless tobacco: Never  Vaping Use   Vaping status: Never Used   Substance and Sexual Activity   Alcohol use: Yes    Alcohol/week: 0.0 standard drinks of alcohol    Comment: occ   Drug use: No   Sexual activity: Yes    Partners: Male    Birth control/protection: Inserts    Comment: nuvaring  Other Topics Concern   Not on file  Social History Narrative   Marital status/children/pets: Married, 2 children. She is from [place] originally and her husband from [place].    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:      -Wears a bicycle helmet riding a bike: Yes     -smoke alarm in the home:Yes     - wears seatbelt: Yes     - Feels safe in their relationships: Yes   Social Determinants of Health   Financial Resource Strain: Not on file  Food Insecurity: Not on file  Transportation Needs: Not on file  Physical Activity: Not on file  Stress: Not on file  Social Connections: Not on file  Intimate Partner Violence: Not on file    Past Surgical History:  Procedure Laterality Date   COLPOSCOPY     1/18 LGSIL   VSD REPAIR  68   In [place]    Family History  Problem Relation Age of Onset   Hypertension Mother    Diabetes Mother    Uterine cancer Mother    Hypertension Father    Diabetes Sister    Breast cancer Paternal Aunt    Diabetes Maternal Grandmother    Ovarian cancer Sister     No Known Allergies  No current outpatient medications on file prior to visit.   No current facility-administered medications on file prior to visit.    BP 120/80   Pulse (!) 59   Temp 98.6 F (37 C) (Oral)   Ht 5' 4.75" (1.645 m)   Wt 194 lb (88 kg)   SpO2 96%   Breastfeeding Yes   BMI 32.53 kg/m       Objective:   Physical Exam Vitals and nursing note reviewed.  Constitutional:      Appearance: Normal appearance.  HENT:     Right Ear: A middle ear effusion is present. Tympanic membrane is not erythematous.     Left Ear: A middle ear effusion is present. Tympanic membrane is  not erythematous.  Cardiovascular:     Rate and Rhythm: Normal rate and regular rhythm.     Pulses: Normal pulses.     Heart sounds: Normal heart sounds.  Pulmonary:     Effort: Pulmonary effort is normal.     Breath sounds: Normal breath sounds.  Skin:    General: Skin is dry.     Findings: No erythema.     Comments: Flesh colored fluid filled blister on right upper and and right lower abdomen  Neurological:     General: No focal deficit present.     Mental Status: She is alert and oriented to person, place, and time.  Psychiatric:        Mood and Affect: Mood normal.        Behavior: Behavior normal.        Thought Content: Thought content normal.        Judgment: Judgment normal.       Assessment & Plan:  1. Blister -Appears as blister from a burn.  Does not appear as shingles.  Shared decision making was done we both decided for watchful waiting.  If more blisters develop she will follow-up.  2. Need for influenza vaccination  - Flu vaccine trivalent PF, 6mos and older(Flulaval,Afluria,Fluarix,Fluzone)  3. Dysfunction of both eustachian tubes - Advised using flonase or claritin  - Follow up with PCP if not improving over the next few weeks   Shirline Frees, NP

## 2023-07-13 ENCOUNTER — Ambulatory Visit (INDEPENDENT_AMBULATORY_CARE_PROVIDER_SITE_OTHER): Payer: Self-pay | Admitting: Family Medicine

## 2023-07-13 DIAGNOSIS — Z91199 Patient's noncompliance with other medical treatment and regimen due to unspecified reason: Secondary | ICD-10-CM

## 2023-07-13 DIAGNOSIS — R7309 Other abnormal glucose: Secondary | ICD-10-CM | POA: Insufficient documentation

## 2023-07-13 NOTE — Progress Notes (Signed)
No show cpe °

## 2023-07-13 NOTE — Patient Instructions (Addendum)
Return in about 1 year (around 07/13/2024) for cpe (20 min).        Great to see you today.  I have refilled the medication(s) we provide.   If labs were collected or images ordered, we will inform you of  results once we have received them and reviewed. We will contact you either by echart message, or telephone call.  Please give ample time to the testing facility, and our office to run,  receive and review results. Please do not call inquiring of results, even if you can see them in your chart. We will contact you as soon as we are able. If it has been over 1 week since the test was completed, and you have not yet heard from [place], then please call us.    - echart message- for normal results that have been seen by the patient already.   - telephone call: abnormal results or if patient has not viewed results in their echart.  If a referral to a specialist was entered for you, please call us in 2 weeks if you have not heard from the specialist office to schedule.

## 2023-08-04 ENCOUNTER — Encounter: Payer: Self-pay | Admitting: Family Medicine

## 2024-05-16 ENCOUNTER — Ambulatory Visit: Payer: Self-pay

## 2024-05-16 ENCOUNTER — Ambulatory Visit (INDEPENDENT_AMBULATORY_CARE_PROVIDER_SITE_OTHER): Admitting: Family Medicine

## 2024-05-16 ENCOUNTER — Encounter: Payer: Self-pay | Admitting: Family Medicine

## 2024-05-16 VITALS — BP 166/74 | HR 56 | Temp 98.2°F | Resp 18 | Ht 64.75 in | Wt 203.0 lb

## 2024-05-16 DIAGNOSIS — Z6834 Body mass index (BMI) 34.0-34.9, adult: Secondary | ICD-10-CM

## 2024-05-16 DIAGNOSIS — E669 Obesity, unspecified: Secondary | ICD-10-CM

## 2024-05-16 DIAGNOSIS — G43011 Migraine without aura, intractable, with status migrainosus: Secondary | ICD-10-CM | POA: Diagnosis not present

## 2024-05-16 MED ORDER — KETOROLAC TROMETHAMINE 60 MG/2ML IM SOLN
60.0000 mg | Freq: Once | INTRAMUSCULAR | Status: AC
  Administered 2024-05-16: 60 mg via INTRAMUSCULAR

## 2024-05-16 MED ORDER — SUMATRIPTAN SUCCINATE 25 MG PO TABS: 25.0000 mg | ORAL_TABLET | ORAL | 0 refills | Status: AC | PRN

## 2024-05-16 NOTE — Assessment & Plan Note (Signed)
 Improved A1c levels to 5.7, indicating better glycemic control. Discussed pharmacological options for weight loss, dependent on insurance coverage. - Advised her to verify insurance coverage for weight loss medications like Wegovy or Zepbound. - Encouraged continued healthy eating and exercise.

## 2024-05-16 NOTE — Telephone Encounter (Signed)
 No further action needed at this time.

## 2024-05-16 NOTE — Progress Notes (Signed)
 Assessment & Plan Intractable migraine without aura and with status migrainosus Recurrent headache with features suggestive of migraine Symptoms consistent with migraine, resistant to usual medications, indicating a severe episode. - Administered Toradol  injection for current headache relief. - Prescribed Imitrex  for future episodes, with dosing instructions. - Education provided on migraines Orders:   ketorolac  (TORADOL ) injection 60 mg   SUMAtriptan  (IMITREX ) 25 MG tablet; Take 1 tablet (25 mg total) by mouth every 2 (two) hours as needed for migraine. May repeat in 2 hours if headache persists or recurs.  (BMI 30-39.9) Improved A1c levels to 5.7, indicating better glycemic control. Discussed pharmacological options for weight loss, dependent on insurance coverage. - Advised her to verify insurance coverage for weight loss medications like Wegovy or Zepbound. - Encouraged continued healthy eating and exercise.      Follow up plan: Return for CPE with PCP (overdue).  [name] Hurst, [name] Hurst, [name] Hurst, [name] Hurst  Subjective:  HPI: [name] Hurst is a 48 y.o. female presenting on 05/16/2024 for Headache (Started Friday night - got worse over the weekend - caused vomiting, some nausea, pain in forehead/Took, IBU, Tylenol  and Excedrin/Feels some better  )  Discussed the use of AI scribe software for clinical note transcription with the patient, who gave verbal consent to proceed.  History of Present Illness   [name] Hurst is a 48 year old female who presents with persistent headache.  She has been experiencing a headache that began on Friday evening, initially mild but worsening by Saturday morning. The headache is described as a heavy sensation located on the forehead, accompanied by dizziness and nausea, leading to vomiting after taking ibuprofen  on an empty stomach. The headache persisted throughout the weekend, causing her to remain in bed most of the time.  The  headache is severe, with a sensation of pressure around the eyes and radiation into the right ear, which has since resolved. Despite taking Tylenol  and Excedrin, which usually alleviate her headaches, the headache has not fully subsided. It is less intense now but still present, and she has never experienced a headache lasting this long before.  She has a history of headaches associated with her vision, which improved after she started wearing glasses. During these episodes, she experiences photophobia, making it difficult to look at screens or bright lights. Excedrin typically provides relief, but it was ineffective this time, even after two doses.  She mentions a previous elevated A1c of 6.1 in 2023, which has since improved to 5.7 three months ago, indicating a history of prediabetes. She is also struggling with weight management despite efforts to exercise and eat healthily.        ROS: Negative unless specifically indicated above in HPI.   Relevant past medical history reviewed and updated as indicated.   Allergies and medications reviewed and updated.   Current Outpatient Medications:    SUMAtriptan  (IMITREX ) 25 MG tablet, Take 1 tablet (25 mg total) by mouth every 2 (two) hours as needed for migraine. May repeat in 2 hours if headache persists or recurs., Disp: 10 tablet, Rfl: 0  Current Facility-Administered Medications:    ketorolac  (TORADOL ) injection 60 mg, 60 mg, Intramuscular, Once,   No Known Allergies  Objective:   BP (!) 166/74   Pulse (!) 56   Temp 98.2 F (36.8 C)   Resp 18   Ht 5' 4.75 (1.645 m)   Wt 203 lb (92.1 kg)   LMP 05/17/2023 (Approximate)   SpO2 98%   Breastfeeding No  BMI 34.04 kg/m    Physical Exam Vitals reviewed.  Constitutional:      General: She is not in acute distress.    Appearance: Normal appearance. She is not ill-appearing, toxic-appearing or diaphoretic.  HENT:     Head: Normocephalic and atraumatic.     Right Ear: Tympanic  membrane, ear canal and external ear normal. There is no impacted cerumen.     Left Ear: Tympanic membrane, ear canal and external ear normal. There is no impacted cerumen.  Eyes:     General: No scleral icterus.       Right eye: No discharge.        Left eye: No discharge.     Extraocular Movements: Extraocular movements intact.     Conjunctiva/sclera: Conjunctivae normal.  Cardiovascular:     Rate and Rhythm: Normal rate and regular rhythm.     Heart sounds: Normal heart sounds. No murmur heard.    No friction rub. No gallop.  Pulmonary:     Effort: Pulmonary effort is normal. No respiratory distress.     Breath sounds: Normal breath sounds. No stridor. No wheezing, rhonchi or rales.  Musculoskeletal:        General: Normal range of motion.     Cervical back: Normal range of motion.  Skin:    General: Skin is warm and dry.     Capillary Refill: Capillary refill takes less than 2 seconds.  Neurological:     General: No focal deficit present.     Mental Status: She is alert and oriented to person, place, and time. Mental status is at baseline.  Psychiatric:        Mood and Affect: Mood normal.        Behavior: Behavior normal.        Thought Content: Thought content normal.        Judgment: Judgment normal.

## 2024-05-16 NOTE — Telephone Encounter (Signed)
 FYI Only or Action Required?: FYI only for provider.  Patient was last seen in primary care on 05/19/2023 by Merna Huxley, NP.  Called Nurse Triage reporting Headache.  Symptoms began several days ago.  Interventions attempted: Rest, hydration, or home remedies.  Symptoms are: unchanged.  Triage Disposition: See HCP Within 4 Hours (Or PCP Triage)  Patient/caregiver understands and will follow disposition?: Yes  Copied from CRM #[id number]. Topic: Clinical - Red Word Triage >> May 16, 2024  8:56 AM Adelita E wrote: [facility] that prompted transfer to Nurse Triage: Head pain since Friday night. Head pain leading to eye pain, also leading to vomiting. OTC medications not helping. Reason for Disposition  [1] SEVERE headache (e.g., excruciating) AND [2] not improved after 2 hours of pain medicine  Answer Assessment - Initial Assessment Questions 1. LOCATION: Where does it hurt?      Frontal headache 2. ONSET: When did the headache start? (e.g., minutes, hours, days)      Started over the weekend 3. PATTERN: Does the pain come and go, or has it been constant since it started?     constant 4. SEVERITY: How bad is the pain? and What does it keep you from doing?  (e.g., Scale 1-10; mild, moderate, or severe)     7 out of 10 5. RECURRENT SYMPTOM: Have you ever had headaches before? If Yes, ask: When was the last time? and What happened that time?      yes 6. CAUSE: What do you think is causing the headache?     unsure 7. MIGRAINE: Have you been diagnosed with migraine headaches? If Yes, ask: Is this headache similar?      yes 8. HEAD INJURY: Has there been any recent injury to your head?      no 9. OTHER SYMPTOMS: Do you have any other symptoms? (e.g., fever, stiff neck, eye pain, sore throat, cold symptoms)     Eye pain, vomiting 10. PREGNANCY: Is there any chance you are pregnant? When was your last menstrual period?       no  Protocols used:  Headache-A-AH

## 2024-06-24 ENCOUNTER — Ambulatory Visit (INDEPENDENT_AMBULATORY_CARE_PROVIDER_SITE_OTHER): Admitting: Family Medicine

## 2024-06-24 DIAGNOSIS — E063 Autoimmune thyroiditis: Secondary | ICD-10-CM

## 2024-06-24 DIAGNOSIS — Z0000 Encounter for general adult medical examination without abnormal findings: Secondary | ICD-10-CM

## 2024-06-24 DIAGNOSIS — Z91199 Patient's noncompliance with other medical treatment and regimen due to unspecified reason: Secondary | ICD-10-CM

## 2024-06-24 DIAGNOSIS — Z23 Encounter for immunization: Secondary | ICD-10-CM

## 2024-06-24 DIAGNOSIS — R7309 Other abnormal glucose: Secondary | ICD-10-CM

## 2024-06-24 NOTE — Progress Notes (Signed)
    Same-day cancel-had her CPE at a new practice- [facility]

## 2024-06-24 NOTE — Patient Instructions (Signed)

## 2024-07-16 ENCOUNTER — Telehealth: Admitting: Family Medicine

## 2024-07-16 DIAGNOSIS — N760 Acute vaginitis: Secondary | ICD-10-CM

## 2024-07-16 MED ORDER — METRONIDAZOLE 500 MG PO TABS: 500.0000 mg | ORAL_TABLET | Freq: Two times a day (BID) | ORAL | 0 refills | Status: AC

## 2024-07-16 NOTE — Progress Notes (Signed)
 We are sorry that you are not feeling well. Here is how we plan to help! Based on what you shared with me it looks like you: May have a vaginosis due to bacteria  Vaginosis is an inflammation of the vagina that can result in discharge, itching and pain. The cause is usually a change in the normal balance of vaginal bacteria or an infection. Vaginosis can also result from reduced estrogen levels after menopause.  The most common causes of vaginosis are:   Bacterial vaginosis which results from an overgrowth of one on several organisms that are normally present in your vagina.   Yeast infections which are caused by a naturally occurring fungus called candida.   Vaginal atrophy (atrophic vaginosis) which results from the thinning of the vagina from reduced estrogen levels after menopause.   Trichomoniasis which is caused by a parasite and is commonly transmitted by sexual intercourse.  Factors that increase your risk of developing vaginosis include: Medications, such as antibiotics and steroids Uncontrolled diabetes Use of hygiene products such as bubble bath, vaginal spray or vaginal deodorant Douching Wearing damp or tight-fitting clothing Using an intrauterine device (IUD) for birth control Hormonal changes, such as those associated with pregnancy, birth control pills or menopause Sexual activity Having a sexually transmitted infection  Your treatment plan is Metronidazole or Flagyl 500mg  twice a day for 7 days.  I have electronically sent this prescription into the pharmacy that you have chosen.  Be sure to take all of the medication as directed. Stop taking any medication if you develop a rash, tongue swelling or shortness of breath. Mothers who are breast feeding should consider pumping and discarding their breast milk while on these antibiotics. However, there is no consensus that infant exposure at these doses would be harmful.  Remember that medication creams can weaken latex  condoms.   HOME CARE:  Good hygiene may prevent some types of vaginosis from recurring and may relieve some symptoms:  Avoid baths, hot tubs and whirlpool spas. Rinse soap from your outer genital area after a shower, and dry the area well to prevent irritation. Don't use scented or harsh soaps, such as those with deodorant or antibacterial action. Avoid irritants. These include scented tampons and pads. Wipe from front to back after using the toilet. Doing so avoids spreading fecal bacteria to your vagina.  Other things that may help prevent vaginosis include:  Don't douche. Your vagina doesn't require cleansing other than normal bathing. Repetitive douching disrupts the normal organisms that reside in the vagina and can actually increase your risk of vaginal infection. Douching won't clear up a vaginal infection. Use a latex condom. Both female and female latex condoms may help you avoid infections spread by sexual contact. Wear cotton underwear. Also wear pantyhose with a cotton crotch. If you feel comfortable without it, skip wearing underwear to bed. Yeast thrives in [place] Your symptoms should improve in the next day or two.  GET HELP RIGHT AWAY IF:  You have pain in your lower abdomen ( pelvic area or over your ovaries) You develop nausea or vomiting You develop a fever Your discharge changes or worsens You have persistent pain with intercourse You develop shortness of breath, a rapid pulse, or you faint.  These symptoms could be signs of problems or infections that need to be evaluated by a medical provider now.  MAKE SURE YOU   Understand these instructions. Will watch your condition. Will get help right away if you are not doing  well or get worse.  Your e-visit answers were reviewed by a board certified advanced clinical practitioner to complete your personal care plan. Depending upon the condition, your plan could have included both over the counter or  prescription medications. Please review your pharmacy choice to make sure that you have choses a pharmacy that is open for you to pick up any needed prescription, Your safety is important to us . If you have drug allergies check your prescription carefully.   You can use MyChart to ask questions about today's visit, request a non-urgent call back, or ask for a work or school excuse for 24 hours related to this e-Visit. If it has been greater than 24 hours you will need to follow up with your provider, or enter a new e-Visit to address those concerns. You will get a MyChart message within the next two days asking about your experience. I hope that your e-visit has been valuable and will speed your recovery.  I have spent 5 minutes in review of e-visit questionnaire, review and updating patient chart, medical decision making and response to patient.   Roosvelt Mater, PA-C
